# Patient Record
Sex: Female | Born: 1970 | Race: Black or African American | Hispanic: No | Marital: Married | Smoking: Never smoker
Health system: Southern US, Community
[De-identification: ages and names within clinical notes are randomized; demographics above are authoritative.]

## PROBLEM LIST (undated history)

## (undated) DIAGNOSIS — I341 Nonrheumatic mitral (valve) prolapse: Secondary | ICD-10-CM

## (undated) DIAGNOSIS — D219 Benign neoplasm of connective and other soft tissue, unspecified: Secondary | ICD-10-CM

## (undated) DIAGNOSIS — R319 Hematuria, unspecified: Secondary | ICD-10-CM

## (undated) DIAGNOSIS — I10 Essential (primary) hypertension: Secondary | ICD-10-CM

## (undated) DIAGNOSIS — C801 Malignant (primary) neoplasm, unspecified: Secondary | ICD-10-CM

## (undated) DIAGNOSIS — N920 Excessive and frequent menstruation with regular cycle: Secondary | ICD-10-CM

---

## 2001-02-09 ENCOUNTER — Other Ambulatory Visit: Admission: RE | Admit: 2001-02-09 | Discharge: 2001-02-09 | Payer: Self-pay | Admitting: Obstetrics and Gynecology

## 2001-11-16 ENCOUNTER — Other Ambulatory Visit: Admission: RE | Admit: 2001-11-16 | Discharge: 2001-11-16 | Payer: Self-pay | Admitting: Obstetrics and Gynecology

## 2002-05-30 ENCOUNTER — Inpatient Hospital Stay (HOSPITAL_COMMUNITY): Admission: AD | Admit: 2002-05-30 | Discharge: 2002-06-02 | Payer: Self-pay | Admitting: Obstetrics and Gynecology

## 2002-06-28 ENCOUNTER — Other Ambulatory Visit: Admission: RE | Admit: 2002-06-28 | Discharge: 2002-06-28 | Payer: Self-pay | Admitting: Obstetrics and Gynecology

## 2003-05-18 ENCOUNTER — Emergency Department (HOSPITAL_COMMUNITY): Admission: EM | Admit: 2003-05-18 | Discharge: 2003-05-18 | Payer: Self-pay | Admitting: Emergency Medicine

## 2003-05-18 ENCOUNTER — Encounter: Payer: Self-pay | Admitting: Emergency Medicine

## 2007-10-14 ENCOUNTER — Inpatient Hospital Stay (HOSPITAL_COMMUNITY): Admission: AD | Admit: 2007-10-14 | Discharge: 2007-10-16 | Payer: Self-pay | Admitting: Obstetrics and Gynecology

## 2011-01-07 ENCOUNTER — Emergency Department: Payer: Self-pay | Admitting: Emergency Medicine

## 2011-03-28 ENCOUNTER — Ambulatory Visit: Payer: Self-pay | Admitting: Urology

## 2011-04-22 LAB — CBC
HCT: 30.5 — ABNORMAL LOW
HCT: 33.5 — ABNORMAL LOW
Hemoglobin: 10.2 — ABNORMAL LOW
Hemoglobin: 11.5 — ABNORMAL LOW
MCHC: 33.5
MCHC: 34.3
MCV: 100.1 — ABNORMAL HIGH
MCV: 99
Platelets: 159
Platelets: 185
RBC: 3.05 — ABNORMAL LOW
RBC: 3.38 — ABNORMAL LOW
RDW: 14.2
RDW: 14.4
WBC: 10.5
WBC: 8.9

## 2011-04-22 LAB — RPR: RPR Ser Ql: NONREACTIVE

## 2015-03-03 ENCOUNTER — Other Ambulatory Visit: Payer: Self-pay

## 2015-03-03 ENCOUNTER — Emergency Department
Admission: EM | Admit: 2015-03-03 | Discharge: 2015-03-04 | Disposition: A | Discharge status: Home / Self Care | Payer: Managed Care, Other (non HMO) | Attending: Emergency Medicine | Admitting: Emergency Medicine

## 2015-03-03 DIAGNOSIS — Z3202 Encounter for pregnancy test, result negative: Secondary | ICD-10-CM | POA: Insufficient documentation

## 2015-03-03 DIAGNOSIS — R1084 Generalized abdominal pain: Secondary | ICD-10-CM | POA: Diagnosis not present

## 2015-03-03 DIAGNOSIS — R1033 Periumbilical pain: Secondary | ICD-10-CM | POA: Diagnosis present

## 2015-03-03 HISTORY — DX: Essential (primary) hypertension: I10

## 2015-03-03 LAB — COMPREHENSIVE METABOLIC PANEL
ALK PHOS: 41 U/L (ref 38–126)
ALT: 12 U/L — AB (ref 14–54)
AST: 16 U/L (ref 15–41)
Albumin: 4.2 g/dL (ref 3.5–5.0)
Anion gap: 7 (ref 5–15)
BILIRUBIN TOTAL: 0.2 mg/dL — AB (ref 0.3–1.2)
BUN: 8 mg/dL (ref 6–20)
CALCIUM: 8.6 mg/dL — AB (ref 8.9–10.3)
CHLORIDE: 103 mmol/L (ref 101–111)
CO2: 27 mmol/L (ref 22–32)
Creatinine, Ser: 0.67 mg/dL (ref 0.44–1.00)
GFR calc non Af Amer: >60 mL/min (ref >60)
GLUCOSE: 83 mg/dL (ref 65–99)
POTASSIUM: 3 mmol/L — AB (ref 3.5–5.1)
Sodium: 137 mmol/L (ref 135–145)
TOTAL PROTEIN: 7.1 g/dL (ref 6.5–8.1)

## 2015-03-03 LAB — URINALYSIS COMPLETE WITH MICROSCOPIC (ARMC ONLY)
Bilirubin Urine: NEGATIVE
Glucose, UA: NEGATIVE mg/dL
Leukocytes, UA: NEGATIVE
NITRITE: NEGATIVE
PH: 6 (ref 5.0–8.0)
Protein, ur: NEGATIVE mg/dL
SPECIFIC GRAVITY, URINE: 1.014 (ref 1.005–1.030)

## 2015-03-03 LAB — CBC
HCT: 36.9 % (ref 35.0–47.0)
Hemoglobin: 12.5 g/dL (ref 12.0–16.0)
MCH: 33.4 pg (ref 26.0–34.0)
MCHC: 34 g/dL (ref 32.0–36.0)
MCV: 98.3 fL (ref 80.0–100.0)
Platelets: 226 10*3/uL (ref 150–440)
RBC: 3.76 MIL/uL — ABNORMAL LOW (ref 3.80–5.20)
RDW: 13.2 % (ref 11.5–14.5)
WBC: 5.4 10*3/uL (ref 3.6–11.0)

## 2015-03-03 LAB — POCT PREGNANCY, URINE: Preg Test, Ur: NEGATIVE

## 2015-03-03 LAB — LIPASE, BLOOD: LIPASE: 29 U/L (ref 22–51)

## 2015-03-03 NOTE — ED Provider Notes (Signed)
[facility] Emergency Department Provider Note   ____________________________________________  Time seen: 0005  I have reviewed the triage vital signs and the nursing notes.   HISTORY  Chief Complaint Abdominal Pain   History limited by: Not Limited   HPI Lindsay Jackson is a 44 y.o. female who presents to the emergency department today because of concerns of abdominal pain, bloating. Patient states symptoms started yesterday morning and been persistent since then. She states they're somewhat worse with movement. She states she tried taking Imodium without any relief. She states the feeling of bloating and swelling. Her pain is located periumbilically. She denies any associated nausea or vomiting. She denies any fevers. He initially went from a clinic who sent her here for CT scan.     No past medical history on file.  There are no active problems to display for this patient.   No past surgical history on file.  No current outpatient prescriptions on file.  Allergies Sulfur  No family history on file.  Social History Not a smoker Does use alcohol Review of Systems  Constitutional: Negative for fever. Cardiovascular: Negative for chest pain. Respiratory: Negative for shortness of breath. Gastrointestinal: Positive for abdominal pain Genitourinary: Negative for dysuria. Musculoskeletal: Negative for back pain. Skin: Negative for rash. Neurological: Negative for headaches, focal weakness or numbness.   10-point ROS otherwise negative.  ____________________________________________   PHYSICAL EXAM:  VITAL SIGNS: ED Triage Vitals  Enc Vitals Group     BP 03/03/15 1916 135/93 mmHg     Pulse Rate 03/03/15 1916 84     Resp 03/03/15 1916 18     Temp 03/03/15 1916 98.1 F (36.7 C)     Temp Source 03/03/15 1916 Oral     SpO2 03/03/15 1916 100 %     Weight 03/03/15 1916 122 lb (55.339 kg)     Height 03/03/15 1916 5\' 6"  (1.676 m)    Constitutional: Alert and oriented. Well appearing and in no distress. Eyes: Conjunctivae are normal. PERRL. Normal extraocular movements. ENT   Head: Normocephalic and atraumatic.   Nose: No congestion/rhinnorhea.   Mouth/Throat: Mucous membranes are moist.   Neck: No stridor. Hematological/Lymphatic/Immunilogical: No cervical lymphadenopathy. Cardiovascular: Normal rate, regular rhythm.  No murmurs, rubs, or gallops. Respiratory: Normal respiratory effort without tachypnea nor retractions. Breath sounds are clear and equal bilaterally. No wheezes/rales/rhonchi. Gastrointestinal: Soft. Tender to palpation periumbilically. There was some tenderness to percussion. Mild guarding. Mild rebound. Genitourinary: Deferred Musculoskeletal: Normal range of motion in all extremities. No joint effusions.  No lower extremity tenderness nor edema. Neurologic:  Normal speech and language. No gross focal neurologic deficits are appreciated. Speech is normal.  Skin:  Skin is warm, dry and intact. No rash noted. Psychiatric: Mood and affect are normal. Speech and behavior are normal. Patient exhibits appropriate insight and judgment.  ____________________________________________    LABS (pertinent positives/negatives)  Labs Reviewed  COMPREHENSIVE METABOLIC PANEL - Abnormal; Notable for the following:    Potassium 3.0 (*)    Calcium 8.6 (*)    ALT 12 (*)    Total Bilirubin 0.2 (*)    All other components within normal limits  CBC - Abnormal; Notable for the following:    RBC 3.76 (*)    All other components within normal limits  URINALYSIS COMPLETEWITH MICROSCOPIC ([facility] ONLY) - Abnormal; Notable for the following:    Color, Urine YELLOW (*)    APPearance CLEAR (*)    Ketones, ur TRACE (*)  Hgb urine dipstick 3+ (*)    Bacteria, UA RARE (*)    Squamous Epithelial / LPF 0-5 (*)    All other components within normal limits  LIPASE, BLOOD  POC URINE PREG, ED  POCT PREGNANCY,  URINE     ____________________________________________   EKG  I, Nance Pear, attending physician, personally viewed and interpreted this EKG  EKG Time: 1924 Rate: 81 Rhythm: normal sinus rhythm Axis: normal Intervals: normal QRS: normal ST changes: no st elevation    ____________________________________________    RADIOLOGY  CT abd/pel IMPRESSION: Enlarged heterogeneous uterus likely represents leiomyomata. Small to moderate amount of free fluid in the pelvis, which can be seen with ruptured ovarian cyst though is nonspecific.  ____________________________________________   PROCEDURES  Procedure(s) performed: None  Critical Care performed: No  ____________________________________________   INITIAL IMPRESSION / ASSESSMENT AND PLAN / ED COURSE  Pertinent labs & imaging results that were available during my care of the patient were reviewed by me and considered in my medical decision making (see chart for details).  Patient presented to the emergency department today because of concerns for abdominal pain. Blood work without any concerning findings. A CT abdomen and pelvis was obtained given mild guarding and rebound on exam. This however did not show any clear etiology of the symptoms. This point the patient is safe for discharge. Will discharge home with Bentyl. Did discuss return precautions.  ____________________________________________   FINAL CLINICAL IMPRESSION(S) / ED DIAGNOSES  Final diagnoses:  Generalized abdominal pain     Nance Pear, MD 03/04/15 [phone]

## 2015-03-03 NOTE — ED Notes (Signed)
Patient reports s/s began yesterday with abd pain and feeling of swelling.  Patient denies vomiting.

## 2015-03-03 NOTE — ED Notes (Signed)
Brought over from [facility] with abd pain and swelling for couple of days

## 2015-03-04 ENCOUNTER — Emergency Department: Payer: Managed Care, Other (non HMO)

## 2015-03-04 MED ORDER — IOHEXOL 240 MG/ML SOLN
25.0000 mL | INTRAMUSCULAR | Status: AC
  Administered 2015-03-04: 25 mL via ORAL

## 2015-03-04 MED ORDER — DICYCLOMINE HCL 20 MG PO TABS: 20.0000 mg | ORAL_TABLET | Freq: Three times a day (TID) | ORAL | Status: DC | PRN

## 2015-03-04 MED ORDER — IOHEXOL 300 MG/ML  SOLN
100.0000 mL | Freq: Once | INTRAMUSCULAR | Status: AC | PRN
  Administered 2015-03-04: 100 mL via INTRAVENOUS

## 2015-03-04 NOTE — Discharge Instructions (Signed)
Please seek medical attention for any high fevers, chest pain, shortness of breath, change in behavior, persistent vomiting, bloody stool or any other new or concerning symptoms.  Abdominal Pain, Women Abdominal (stomach, pelvic, or belly) pain can be caused by many things. It is important to tell your doctor:  The location of the pain.  Does it come and go or is it present all the time?  Are there things that start the pain (eating certain foods, exercise)?  Are there other symptoms associated with the pain (fever, nausea, vomiting, diarrhea)? All of this is helpful to know when trying to find the cause of the pain. CAUSES   Stomach: virus or bacteria infection, or ulcer.  Intestine: appendicitis (inflamed appendix), regional ileitis (Crohn's disease), ulcerative colitis (inflamed colon), irritable bowel syndrome, diverticulitis (inflamed diverticulum of the colon), or cancer of the stomach or intestine.  Gallbladder disease or stones in the gallbladder.  Kidney disease, kidney stones, or infection.  Pancreas infection or cancer.  Fibromyalgia (pain disorder).  Diseases of the female organs:  Uterus: fibroid (non-cancerous) tumors or infection.  Fallopian tubes: infection or tubal pregnancy.  Ovary: cysts or tumors.  Pelvic adhesions (scar tissue).  Endometriosis (uterus lining tissue growing in the pelvis and on the pelvic organs).  Pelvic congestion syndrome (female organs filling up with blood just before the menstrual period).  Pain with the menstrual period.  Pain with ovulation (producing an egg).  Pain with an IUD (intrauterine device, birth control) in the uterus.  Cancer of the female organs.  Functional pain (pain not caused by a disease, may improve without treatment).  Psychological pain.  Depression. DIAGNOSIS  Your doctor will decide the seriousness of your pain by doing an examination.  Blood tests.  X-rays.  Ultrasound.  CT scan  (computed tomography, special type of X-ray).  MRI (magnetic resonance imaging).  Cultures, for infection.  Barium enema (dye inserted in the large intestine, to better view it with X-rays).  Colonoscopy (looking in intestine with a lighted tube).  Laparoscopy (minor surgery, looking in abdomen with a lighted tube).  Major abdominal exploratory surgery (looking in abdomen with a large incision). TREATMENT  The treatment will depend on the cause of the pain.   Many cases can be observed and treated at home.  Over-the-counter medicines recommended by your caregiver.  Prescription medicine.  Antibiotics, for infection.  Birth control pills, for painful periods or for ovulation pain.  Hormone treatment, for endometriosis.  Nerve blocking injections.  Physical therapy.  Antidepressants.  Counseling with a psychologist or psychiatrist.  Minor or major surgery. HOME CARE INSTRUCTIONS   Do not take laxatives, unless directed by your caregiver.  Take over-the-counter pain medicine only if ordered by your caregiver. Do not take aspirin because it can cause an upset stomach or bleeding.  Try a clear liquid diet (broth or water) as ordered by your caregiver. Slowly move to a bland diet, as tolerated, if the pain is related to the stomach or intestine.  Have a thermometer and take your temperature several times a day, and record it.  Bed rest and sleep, if it helps the pain.  Avoid sexual intercourse, if it causes pain.  Avoid stressful situations.  Keep your follow-up appointments and tests, as your caregiver orders.  If the pain does not go away with medicine or surgery, you may try:  Acupuncture.  Relaxation exercises (yoga, meditation).  Group therapy.  Counseling. SEEK MEDICAL CARE IF:   You notice certain foods cause stomach  pain.  Your home care treatment is not helping your pain.  You need stronger pain medicine.  You want your IUD removed.  You  feel faint or lightheaded.  You develop nausea and vomiting.  You develop a rash.  You are having side effects or an allergy to your medicine. SEEK IMMEDIATE MEDICAL CARE IF:   Your pain does not go away or gets worse.  You have a fever.  Your pain is felt only in portions of the abdomen. The right side could possibly be appendicitis. The left lower portion of the abdomen could be colitis or diverticulitis.  You are passing blood in your stools (bright red or black tarry stools, with or without vomiting).  You have blood in your urine.  You develop chills, with or without a fever.  You pass out. MAKE SURE YOU:   Understand these instructions.  Will watch your condition.  Will get help right away if you are not doing well or get worse. Document Released: 05/12/2007 Document Revised: 11/29/2013 Document Reviewed: 06/01/2009 [facility] Patient Information 2015 [place], [place]. This information is not intended to replace advice given to you by your health care provider. Make sure you discuss any questions you have with your health care provider.

## 2015-03-04 NOTE — ED Notes (Signed)
Dr. Goodman at bedside.  

## 2017-01-23 IMAGING — CT CT ABD-PELV W/ CM
2 of 5 series · 17 of 46 positions shown, 19 images · IV contrast (omnipaque)
Comparison: None.

CLINICAL DATA: Abdominal pain and swelling beginning yesterday. No
vomiting. Evaluate periumbilical pain.

EXAM:
CT ABDOMEN AND PELVIS WITH CONTRAST
TECHNIQUE: Multidetector CT imaging of the abdomen and pelvis was performed
using the standard protocol following bolus administration of
intravenous contrast.
CONTRAST:  100mL OMNIPAQUE IOHEXOL 300 MG/ML  SOLN

[Series 2: routine abd pel with · axial · 0.60mm/px · z∈[-441,-76]mm · 14 of 83 slices shown, 16 images]
[im 5/83  soft-tissue]
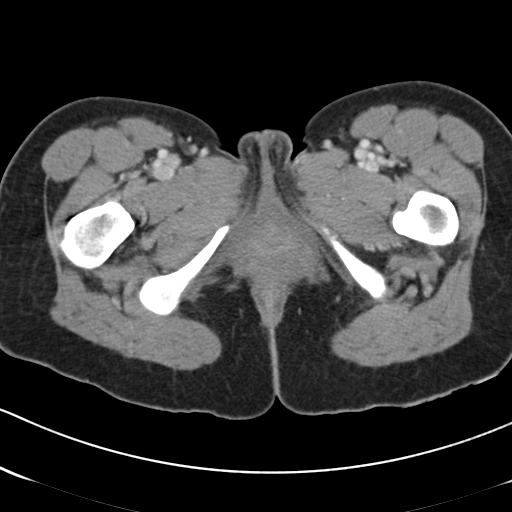
[im 5/83  bone]
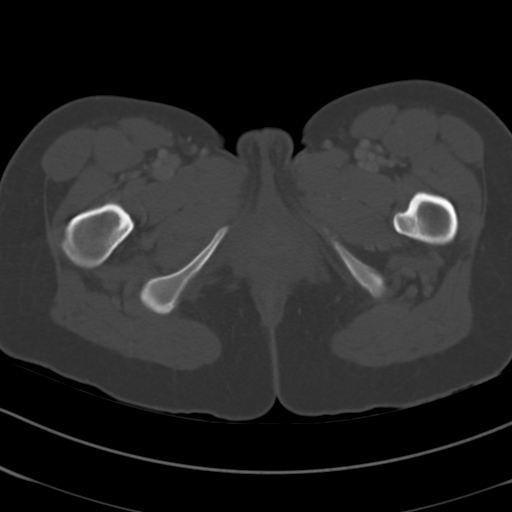
[im 9/83  soft-tissue]
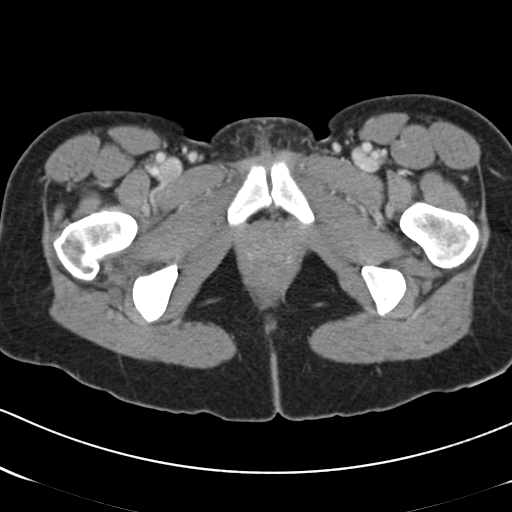
[im 18/83  soft-tissue]
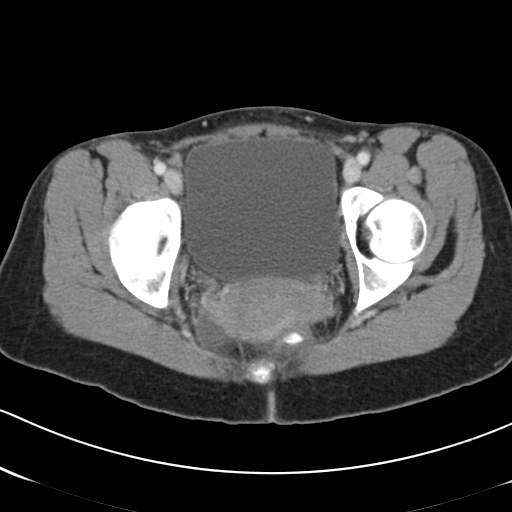
[im 22/83  soft-tissue]
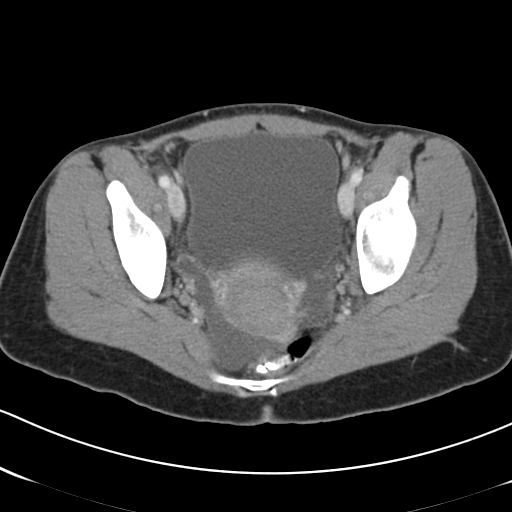
[im 26/83  soft-tissue]
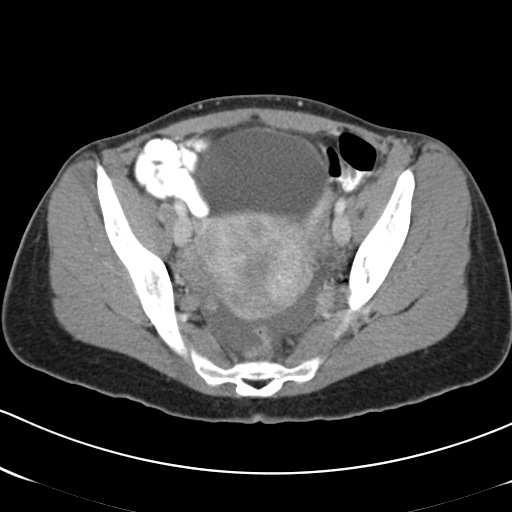
[im 35/83  soft-tissue]
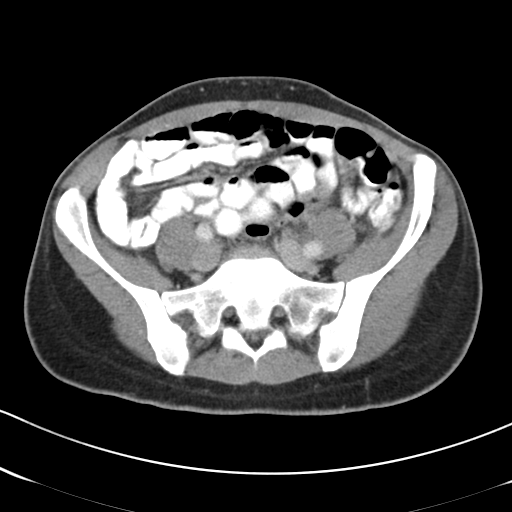
[im 39/83  soft-tissue]
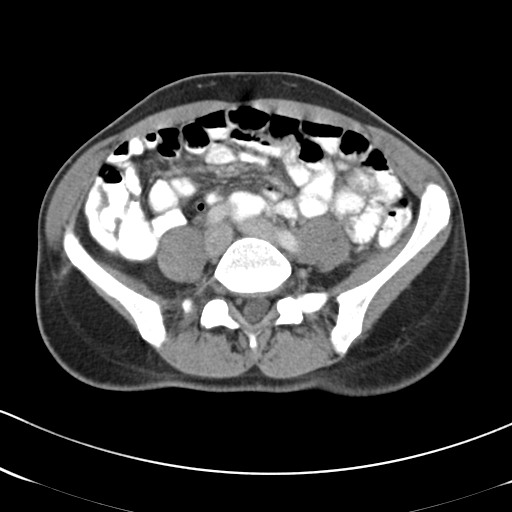
[im 44/83  soft-tissue]
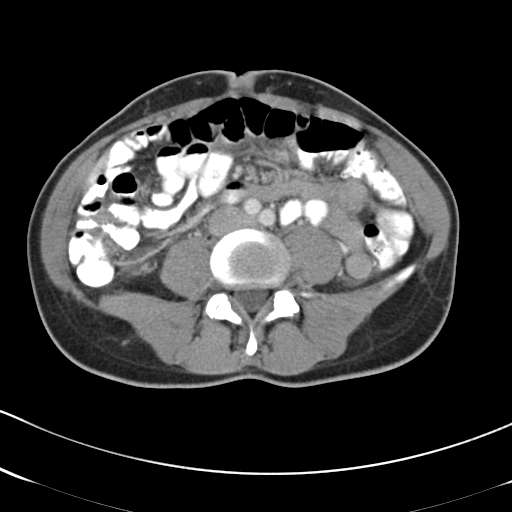
[im 48/83  soft-tissue]
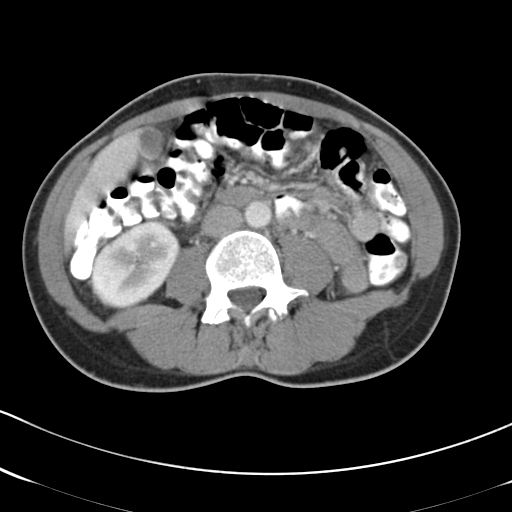
[im 48/83  bone]
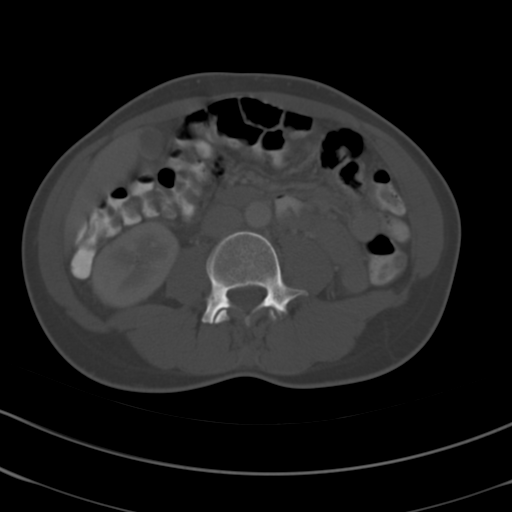
[im 57/83  soft-tissue]
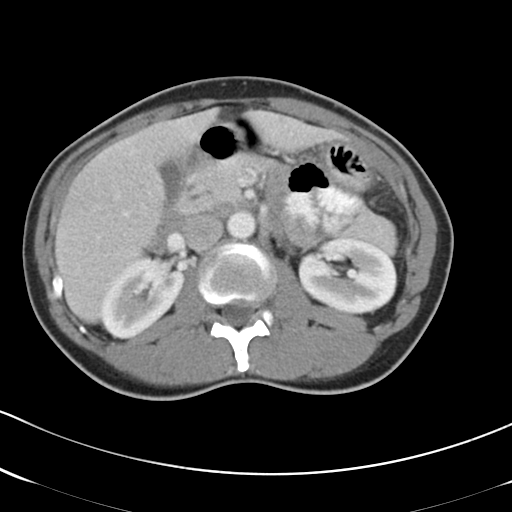
[im 61/83  soft-tissue]
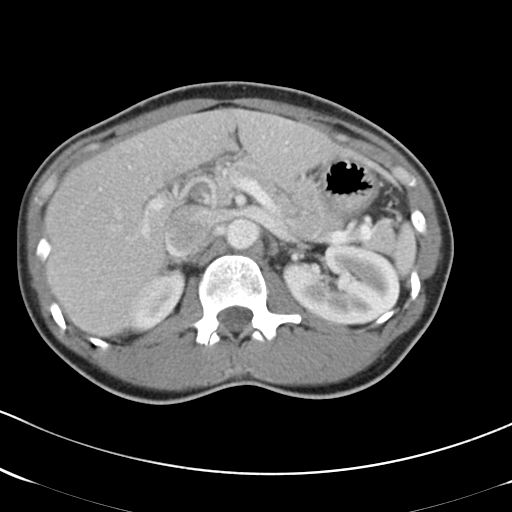
[im 65/83  soft-tissue]
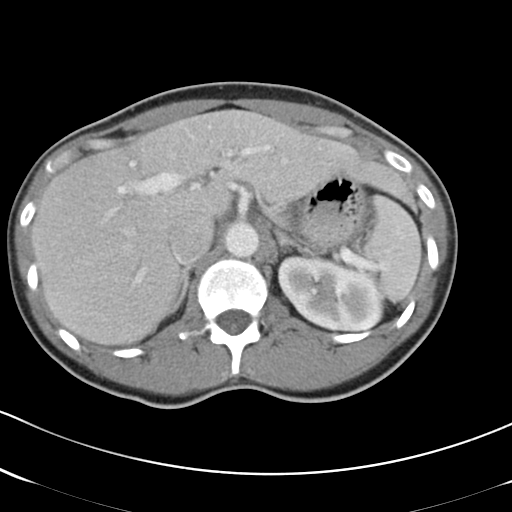
[im 74/83  soft-tissue]
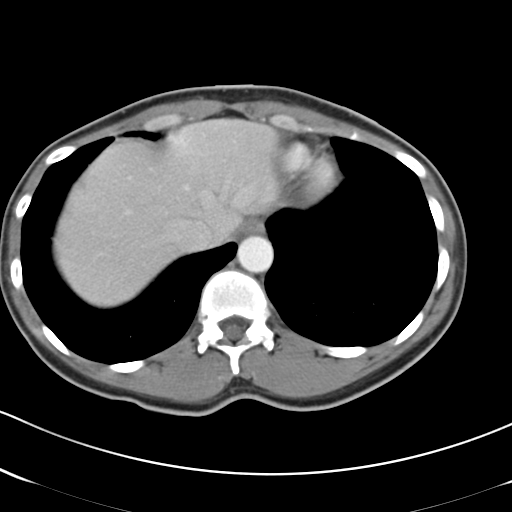
[im 78/83  soft-tissue]
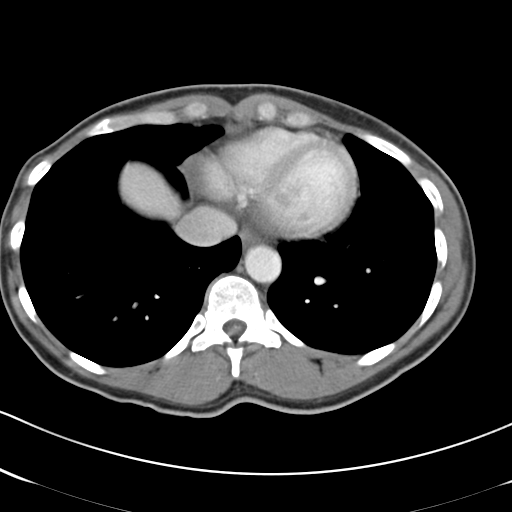

[Series 6: cor routine abd pel with · coronal · 0.85mm/px · 3 of 105 slices shown]
[im 35/105  soft-tissue]
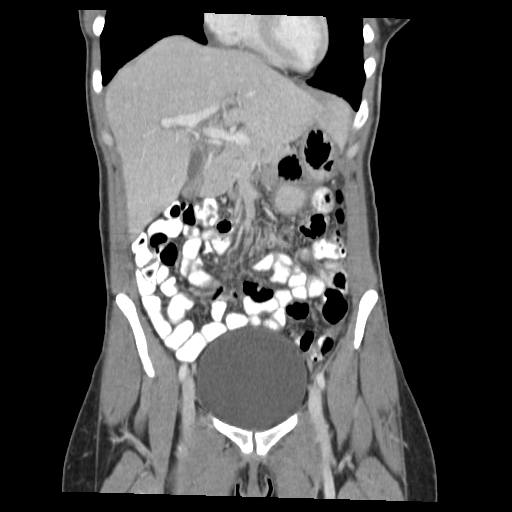
[im 47/105  soft-tissue]
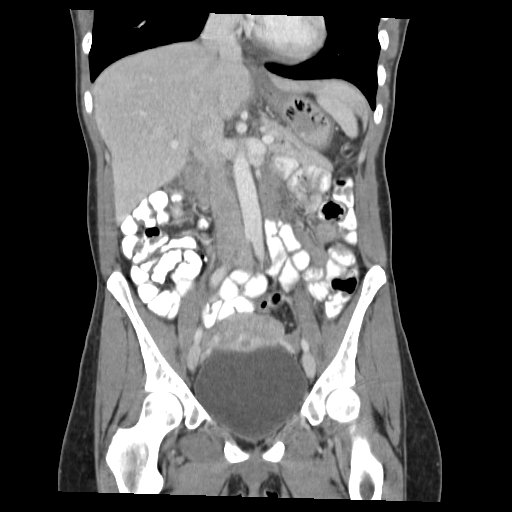
[im 58/105  soft-tissue]
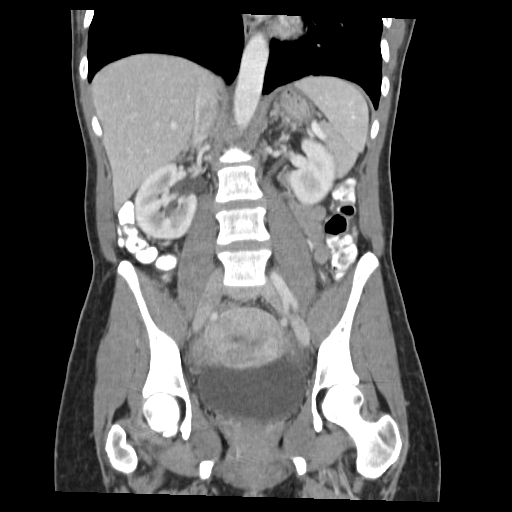

[17 of 46 positions shown; findings below may reference images not displayed]

FINDINGS: LUNG BASES: Included view of the lung bases are clear. Visualized
heart and pericardium are unremarkable.

SOLID ORGANS: The liver, spleen, gallbladder, pancreas and adrenal
glands are unremarkable.

GASTROINTESTINAL TRACT: The stomach, small and large bowel are
normal in course and caliber without inflammatory changes. Normal
appendix.

KIDNEYS/ URINARY TRACT: Kidneys are orthotopic, demonstrating
symmetric enhancement. No nephrolithiasis, hydronephrosis or solid
renal masses. The unopacified ureters are normal in course and
caliber. Delayed imaging through the kidneys demonstrates symmetric
prompt contrast excretion within the proximal urinary collecting
system. Urinary bladder is partially distended and unremarkable.

PERITONEUM/RETROPERITONEUM: Aortoiliac vessels are normal in course
and caliber. No lymphadenopathy by CT size criteria. Mildly enlarged
heterogeneous uterus. Small to moderate amount of low-density free
fluid in the pelvic cul-de-sac. No intraperitoneal free air.

SOFT TISSUE/OSSEOUS STRUCTURES: Non-suspicious.
IMPRESSION: Enlarged heterogeneous uterus likely represents leiomyomata. Small
to moderate amount of free fluid in the pelvis, which can be seen
with ruptured ovarian cyst though is nonspecific.

## 2019-03-19 NOTE — H&P (Signed)
Patient name Lindsay Jackson, Rygg DICTATION# [id number] CSN# [id number]  [name], MD 03/19/2019 11:21 AM

## 2019-03-22 NOTE — H&P (Signed)
NAME: Lindsay Jackson, VALDEZ MEDICAL RECORD [id number] ACCOUNT [id number] DATE OF BIRTH:[birth year] FACILITY: [facility] LOCATION:  PHYSICIAN:Makesha Belitz Sherran Needs, MD  HISTORY AND PHYSICAL  DATE OF ADMISSION:  04/08/2019  HISTORY OF PRESENT ILLNESS:  The patient is a 48 year old gravida 3, para 2 female who presents for total abdominal hysterectomy and removal of both fallopian tubes.  In relation to the present admission, the patient did have enlarging uterine fibroids with associated menorrhagia.  This has been associated with significant anemia.  We have tried to control it with various options.  She is now on iron sulfate  supplementation.  We have had slight improvement of her hemoglobin.  We had discussed options.  This could include radiological embolization of the fibroids.  We were a little concerned about using hormonal agents as she does have hypertension.  In view  of this, the patient now presents for total abdominal hysterectomy and removal of both fallopian tubes.  ALLERGIES:  SULFA.  MEDICATIONS:  She is on enalapril 10 mg tablet, hydrochlorothiazide 25 mg, and temporary on birth control pills.  PAST MEDICAL HISTORY:  Usual childhood diseases without any significant sequelae.  She does have hypertension under active management.  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  Otherwise, usual  childhood diseases without sequelae.  FAMILY HISTORY:  Noncontributory.  SOCIAL HISTORY:  Reveals no tobacco or alcohol use.  REVIEW OF SYSTEMS:  Noncontributory.  PHYSICAL EXAMINATION: VITAL SIGNS:  The patient is afebrile with stable vital signs. HEENT:  Normocephalic.  Pupils equal, round, reactive to light and accommodation.  Extraocular movements are intact.  Sclerae and conjunctivae to clear.  Oropharynx is clear.  Thyroid nonpalpable. BREASTS:  Not examined. LUNGS:  Clear to auscultation and percussion. CARDIOVASCULAR:  Regular rhythm  and rate without murmurs or gallops. ABDOMEN:  The uterus is palpable above the symphysis about halfway to the umbilicus.  No other masses, organomegaly, or tenderness. PELVIC:  Normal external genitalia.  Vaginal mucosa is clear.  Cervix unremarkable.  Uterus approximately 15 weeks in size.  Adnexa unremarkable. EXTREMITIES:  Trace edema. NEUROLOGIC:  Within normal limits.  IMPRESSION: 1.  Enlarging uterine fibroids with abnormal uterine bleeding. 2.  Anemia. 3.  Hypertension. 4.  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  PLAN:  The patient to undergo total abdominal hysterectomy with removal of fallopian tubes.  The risks of surgery have been discussed including the risk of infection, the risk of hemorrhage that could require transfusion with the risk of AIDS or  hepatitis, the risk of injury to adjacent organs including bladder, bowel, ureters that could require further exploratory surgery, and the risk of deep venous thrombosis and pulmonary embolus.  The patient expressed understanding of indication, risks,  and alternatives.  LN/NUANCE  D:03/19/2019 T:03/19/2019 JOB:[id number]

## 2019-04-01 NOTE — Patient Instructions (Addendum)
YOU ARE SCHEDULED FOR A COVID TEST ___9-8-2020______@____11 :00AM________. THIS TEST MUST BE DONE BEFORE SURGERY. GO TO  [address], [place], [address] AND REMAIN IN YOUR CAR, THIS IS A DRIVE UP TEST. ONCE YOUR COVID TEST IS DONE PLEASE FOLLOW ALL THE QUARANTINE  INSTRUCTIONS GIVEN IN YOUR HANDOUT.      Your procedure is scheduled on 04-08-2019   Report to [facility] AT  5:30A. M.   Call this number if you have problems the morning of surgery  :[phone].   OUR ADDRESS IS [address].  WE ARE LOCATED IN [facility].                                     REMEMBER:  DO NOT EAT FOOD OR DRINK LIQUIDS AFTER MIDNIGHT .   TAKE THESE MEDICATIONS MORNING OF SURGERY WITH A SIP OF WATER:  ________NONE___________   YOU ARE SPENDING THE NIGHT AFTER SURGERY AT [facility]. 1 VISITOR IS ALLOWED IN WAITING ROOM ONLY DAY OF SURGERY. NO VISITOR MAY SPEND THE NIGHT.                                    DO NOT WEAR JEWERLY, MAKE UP, OR NAIL POLISH,  DO NOT WEAR LOTIONS, POWDERS, PERFUMES OR DEODORANT. DO NOT SHAVE FOR 24 HOURS PRIOR TO DAY OF SURGERY. MEN MAY SHAVE FACE AND NECK. CONTACTS, GLASSES, OR DENTURES MAY NOT BE WORN TO SURGERY.                                    [facility] IS NOT RESPONSIBLE  FOR ANY BELONGINGS.                                                                    Marland Kitchen                                                                                                    [facility] - Preparing for Surgery Before surgery, you can play an important role.  Because skin is not sterile, your skin needs to be as free of germs as possible.  You can reduce the number of germs on your skin by washing with CHG (chlorahexidine gluconate) soap before surgery.  CHG is an antiseptic cleaner which kills germs and bonds with the skin to continue killing germs even after washing. Please DO NOT use if you have an allergy to CHG or  antibacterial soaps.  If your skin becomes reddened/irritated stop using the CHG and inform your nurse when you arrive at  Short Stay. Do not shave (including legs and underarms) for at least 48 hours prior to the first CHG shower.  You may shave your face/neck. Please follow these instructions carefully:  1.  Shower with CHG Soap the night before surgery and the  morning of Surgery.  2.  If you choose to wash your hair, wash your hair first as usual with your  normal  shampoo.  3.  After you shampoo, rinse your hair and body thoroughly to remove the  shampoo.                           4.  Use CHG as you would any other liquid soap.  You can apply chg directly  to the skin and wash                       Gently with a scrungie or clean washcloth.  5.  Apply the CHG Soap to your body ONLY FROM THE NECK DOWN.   Do not use on face/ open                           Wound or open sores. Avoid contact with eyes, ears mouth and genitals (private parts).                       Wash face,  Genitals (private parts) with your normal soap.             6.  Wash thoroughly, paying special attention to the area where your surgery  will be performed.  7.  Thoroughly rinse your body with warm water from the neck down.  8.  DO NOT shower/wash with your normal soap after using and rinsing off  the CHG Soap.                9.  Pat yourself dry with a clean towel.            10.  Wear clean pajamas.            11.  Place clean sheets on your bed the night of your first shower and do not  sleep with pets. Day of Surgery : Do not apply any lotions/deodorants the morning of surgery.  Please wear clean clothes to the hospital/surgery center.  FAILURE TO FOLLOW THESE INSTRUCTIONS MAY RESULT IN THE CANCELLATION OF YOUR SURGERY PATIENT SIGNATURE_________________________________  NURSE SIGNATURE__________________________________  ________________________________________________________________________   Lindsay Jackson  An incentive spirometer is a tool that can help keep your lungs clear and active. This tool measures how well you are filling your lungs with each breath. Taking long deep breaths may help reverse or decrease the chance of developing breathing (pulmonary) problems (especially infection) following:  A long period of time when you are unable to move or be active. BEFORE THE PROCEDURE   If the spirometer includes an indicator to show your best effort, your nurse or respiratory therapist will set it to a desired goal.  If possible, sit up straight or lean slightly forward. Try not to slouch.  Hold the incentive spirometer in an upright position. INSTRUCTIONS FOR USE  1. Sit on the edge of your bed if possible, or sit up as far as you can in bed or on a chair. 2. Hold the incentive spirometer in an upright position. 3. Breathe out normally. 4. Place the  mouthpiece in your mouth and seal your lips tightly around it. 5. Breathe in slowly and as deeply as possible, raising the piston or the ball toward the top of the column. 6. Hold your breath for 3-5 seconds or for as long as possible. Allow the piston or ball to fall to the bottom of the column. 7. Remove the mouthpiece from your mouth and breathe out normally. 8. Rest for a few seconds and repeat Steps 1 through 7 at least 10 times every 1-2 hours when you are awake. Take your time and take a few normal breaths between deep breaths. 9. The spirometer may include an indicator to show your best effort. Use the indicator as a goal to work toward during each repetition. 10. After each set of 10 deep breaths, practice coughing to be sure your lungs are clear. If you have an incision (the cut made at the time of surgery), support your incision when coughing by placing a pillow or rolled up towels firmly against it. Once you are able to get out of bed, walk around indoors and cough well. You may stop using the incentive spirometer when  instructed by your caregiver.  RISKS AND COMPLICATIONS  Take your time so you do not get dizzy or light-headed.  If you are in pain, you may need to take or ask for pain medication before doing incentive spirometry. It is harder to take a deep breath if you are having pain. AFTER USE  Rest and breathe slowly and easily.  It can be helpful to keep track of a log of your progress. Your caregiver can provide you with a simple table to help with this. If you are using the spirometer at home, follow these instructions: [place] IF:   You are having difficultly using the spirometer.  You have trouble using the spirometer as often as instructed.  Your pain medication is not giving enough relief while using the spirometer.  You develop fever of 100.5 F (38.1 C) or higher. SEEK IMMEDIATE MEDICAL CARE IF:   You cough up bloody sputum that had not been present before.  You develop fever of 102 F (38.9 C) or greater.  You develop worsening pain at or near the incision site. MAKE SURE YOU:   Understand these instructions.  Will watch your condition.  Will get help right away if you are not doing well or get worse. Document Released: 11/25/2006 Document Revised: 10/07/2011 Document Reviewed: 01/26/2007 [facility] Patient Information 2014 [place], [place].    ________________________________________________________________________

## 2019-04-02 ENCOUNTER — Encounter (HOSPITAL_COMMUNITY): Payer: Self-pay

## 2019-04-02 ENCOUNTER — Encounter (HOSPITAL_COMMUNITY)
Admission: RE | Admit: 2019-04-02 | Discharge: 2019-04-02 | Disposition: A | Discharge status: Home / Self Care | Payer: Managed Care, Other (non HMO) | Source: Ambulatory Visit | Attending: Obstetrics and Gynecology | Admitting: Obstetrics and Gynecology

## 2019-04-02 ENCOUNTER — Other Ambulatory Visit: Payer: Self-pay

## 2019-04-02 DIAGNOSIS — Z01818 Encounter for other preprocedural examination: Secondary | ICD-10-CM | POA: Insufficient documentation

## 2019-04-02 DIAGNOSIS — I10 Essential (primary) hypertension: Secondary | ICD-10-CM | POA: Diagnosis not present

## 2019-04-02 DIAGNOSIS — N920 Excessive and frequent menstruation with regular cycle: Secondary | ICD-10-CM | POA: Diagnosis not present

## 2019-04-02 DIAGNOSIS — D259 Leiomyoma of uterus, unspecified: Secondary | ICD-10-CM | POA: Diagnosis not present

## 2019-04-02 HISTORY — DX: Malignant (primary) neoplasm, unspecified: C80.1

## 2019-04-02 HISTORY — DX: Benign neoplasm of connective and other soft tissue, unspecified: D21.9

## 2019-04-02 HISTORY — DX: Hematuria, unspecified: R31.9

## 2019-04-02 HISTORY — DX: Nonrheumatic mitral (valve) prolapse: I34.1

## 2019-04-02 HISTORY — DX: Excessive and frequent menstruation with regular cycle: N92.0

## 2019-04-02 LAB — COMPREHENSIVE METABOLIC PANEL
ALT: 12 U/L (ref 0–44)
AST: 17 U/L (ref 15–41)
Albumin: 4.1 g/dL (ref 3.5–5.0)
Alkaline Phosphatase: 40 U/L (ref 38–126)
Anion gap: 6 (ref 5–15)
BUN: 10 mg/dL (ref 6–20)
CO2: 26 mmol/L (ref 22–32)
Calcium: 9.1 mg/dL (ref 8.9–10.3)
Chloride: 107 mmol/L (ref 98–111)
Creatinine, Ser: 0.78 mg/dL (ref 0.44–1.00)
GFR calc Af Amer: >60 mL/min (ref >60)
GFR calc non Af Amer: >60 mL/min (ref >60)
Glucose, Bld: 84 mg/dL (ref 70–99)
Potassium: 3.9 mmol/L (ref 3.5–5.1)
Sodium: 139 mmol/L (ref 135–145)
Total Bilirubin: 0.6 mg/dL (ref 0.3–1.2)
Total Protein: 7.3 g/dL (ref 6.5–8.1)

## 2019-04-02 LAB — CBC
HCT: 37.5 % (ref 36.0–46.0)
Hemoglobin: 11.8 g/dL — ABNORMAL LOW (ref 12.0–15.0)
MCH: 32.7 pg (ref 26.0–34.0)
MCHC: 31.5 g/dL (ref 30.0–36.0)
MCV: 103.9 fL — ABNORMAL HIGH (ref 80.0–100.0)
Platelets: 280 10*3/uL (ref 150–400)
RBC: 3.61 MIL/uL — ABNORMAL LOW (ref 3.87–5.11)
RDW: 13 % (ref 11.5–15.5)
WBC: 5.3 10*3/uL (ref 4.0–10.5)
nRBC: 0 % (ref 0.0–0.2)

## 2019-04-02 LAB — ABO/RH: ABO/RH(D): B POS

## 2019-04-02 LAB — HCG, SERUM, QUALITATIVE: Preg, Serum: NEGATIVE

## 2019-04-02 NOTE — Progress Notes (Signed)
PCP - [facility] in [place]  Cardiologist -   Chest x-ray -  EKG - epic 2020 Stress Test -  ECHO -  Cardiac Cath -   Sleep Study -  CPAP -   Fasting Blood Sugar -  Checks Blood Sugar _____ times a day  Blood Thinner Instructions: Aspirin Instructions: Last Dose:  Anesthesia review:   Patient reports in 2015 during colonoscopy , her blood pressure was reportedly elevated. She had no dx of of htn prior to colonoscopy sp she was sent for eval. She reports bc of her hx of a mitral valve prolapse dx in her 14s, she was sent was ECHO which she says was normal. She reports she was then placed on BP medication. She denies ever having any cardiac symptoms in the past nor today . EKG obtained at PAT appt today .   Patient denies shortness of breath, fever, cough and chest pain at PAT appointment   Patient verbalized understanding of instructions that were given to them at the PAT appointment. Patient was also instructed that they will need to review over the PAT instructions again at home before surgery.

## 2019-04-06 ENCOUNTER — Other Ambulatory Visit (HOSPITAL_COMMUNITY)
Admission: RE | Admit: 2019-04-06 | Discharge: 2019-04-06 | Disposition: A | Discharge status: Home / Self Care | Payer: Managed Care, Other (non HMO) | Source: Ambulatory Visit | Attending: Obstetrics and Gynecology | Admitting: Obstetrics and Gynecology

## 2019-04-06 DIAGNOSIS — Z20828 Contact with and (suspected) exposure to other viral communicable diseases: Secondary | ICD-10-CM | POA: Insufficient documentation

## 2019-04-06 DIAGNOSIS — Z01812 Encounter for preprocedural laboratory examination: Secondary | ICD-10-CM | POA: Insufficient documentation

## 2019-04-07 LAB — SARS CORONAVIRUS 2 (TAT 6-24 HRS): SARS Coronavirus 2: NEGATIVE

## 2019-04-07 NOTE — Anesthesia Preprocedure Evaluation (Addendum)
Anesthesia Evaluation  Patient identified by MRN, date of birth, ID band Patient awake    Reviewed: Allergy & Precautions, NPO status   Airway Mallampati: II  TM Distance: >3 FB     Dental  (+) Dental Advisory Given, Chipped   Pulmonary    breath sounds clear to auscultation       Cardiovascular hypertension,  Rhythm:Regular Rate:Normal  History noted CG   Neuro/Psych    GI/Hepatic negative GI ROS, Neg liver ROS,   Endo/Other  negative endocrine ROS  Renal/GU negative Renal ROS     Musculoskeletal   Abdominal   Peds  Hematology   Anesthesia Other Findings   Reproductive/Obstetrics                           Anesthesia Physical Anesthesia Plan  ASA: III  Anesthesia Plan: General   Post-op Pain Management:    Induction: Intravenous  PONV Risk Score and Plan: 3 and Ondansetron, Dexamethasone and Midazolam  Airway Management Planned: Oral ETT  Additional Equipment:   Intra-op Plan:   Post-operative Plan:   Informed Consent: I have reviewed the patients History and Physical, chart, labs and discussed the procedure including the risks, benefits and alternatives for the proposed anesthesia with the patient or authorized representative who has indicated his/her understanding and acceptance.     Dental advisory given  Plan Discussed with: Anesthesiologist and CRNA  Anesthesia Plan Comments:        Anesthesia Quick Evaluation

## 2019-04-08 ENCOUNTER — Observation Stay (HOSPITAL_BASED_OUTPATIENT_CLINIC_OR_DEPARTMENT_OTHER)
Admission: RE | Admit: 2019-04-08 | Discharge: 2019-04-09 | Disposition: A | Discharge status: Home / Self Care | Payer: Managed Care, Other (non HMO) | Attending: Obstetrics and Gynecology | Admitting: Obstetrics and Gynecology

## 2019-04-08 ENCOUNTER — Encounter (HOSPITAL_BASED_OUTPATIENT_CLINIC_OR_DEPARTMENT_OTHER): Payer: Self-pay | Admitting: *Deleted

## 2019-04-08 ENCOUNTER — Observation Stay (HOSPITAL_BASED_OUTPATIENT_CLINIC_OR_DEPARTMENT_OTHER): Payer: Managed Care, Other (non HMO) | Admitting: Physician Assistant

## 2019-04-08 ENCOUNTER — Observation Stay (HOSPITAL_BASED_OUTPATIENT_CLINIC_OR_DEPARTMENT_OTHER): Payer: Managed Care, Other (non HMO) | Admitting: Anesthesiology

## 2019-04-08 ENCOUNTER — Encounter (HOSPITAL_BASED_OUTPATIENT_CLINIC_OR_DEPARTMENT_OTHER)
Admission: RE | Disposition: A | Discharge status: Home / Self Care | Payer: Self-pay | Source: Home / Self Care | Attending: Obstetrics and Gynecology

## 2019-04-08 ENCOUNTER — Other Ambulatory Visit: Payer: Self-pay

## 2019-04-08 DIAGNOSIS — Z90710 Acquired absence of both cervix and uterus: Secondary | ICD-10-CM | POA: Diagnosis present

## 2019-04-08 DIAGNOSIS — N920 Excessive and frequent menstruation with regular cycle: Secondary | ICD-10-CM | POA: Diagnosis not present

## 2019-04-08 DIAGNOSIS — Z79899 Other long term (current) drug therapy: Secondary | ICD-10-CM | POA: Insufficient documentation

## 2019-04-08 DIAGNOSIS — I10 Essential (primary) hypertension: Secondary | ICD-10-CM | POA: Diagnosis not present

## 2019-04-08 DIAGNOSIS — N800 Endometriosis of uterus: Secondary | ICD-10-CM | POA: Diagnosis not present

## 2019-04-08 DIAGNOSIS — D259 Leiomyoma of uterus, unspecified: Secondary | ICD-10-CM | POA: Diagnosis present

## 2019-04-08 DIAGNOSIS — Z882 Allergy status to sulfonamides status: Secondary | ICD-10-CM | POA: Insufficient documentation

## 2019-04-08 DIAGNOSIS — D251 Intramural leiomyoma of uterus: Principal | ICD-10-CM | POA: Insufficient documentation

## 2019-04-08 DIAGNOSIS — D649 Anemia, unspecified: Secondary | ICD-10-CM | POA: Diagnosis not present

## 2019-04-08 HISTORY — PX: ABDOMINAL HYSTERECTOMY: SHX81

## 2019-04-08 LAB — TYPE AND SCREEN
ABO/RH(D): B POS
Antibody Screen: NEGATIVE

## 2019-04-08 LAB — POCT PREGNANCY, URINE: Preg Test, Ur: NEGATIVE

## 2019-04-08 SURGERY — HYSTERECTOMY, ABDOMINAL
Anesthesia: General | Site: Abdomen | Laterality: Bilateral

## 2019-04-08 MED ORDER — FENTANYL CITRATE (PF) 100 MCG/2ML IJ SOLN
25.0000 ug | INTRAMUSCULAR | Status: DC | PRN
  Administered 2019-04-08 (×2): 50 ug via INTRAVENOUS
  Filled 2019-04-08: qty 1

## 2019-04-08 MED ORDER — FENTANYL CITRATE (PF) 100 MCG/2ML IJ SOLN
INTRAMUSCULAR | Status: AC
  Filled 2019-04-08: qty 2

## 2019-04-08 MED ORDER — DIPHENHYDRAMINE HCL 50 MG/ML IJ SOLN
12.5000 mg | Freq: Four times a day (QID) | INTRAMUSCULAR | Status: DC | PRN
  Filled 2019-04-08: qty 0.25

## 2019-04-08 MED ORDER — ONDANSETRON HCL 4 MG/2ML IJ SOLN
4.0000 mg | Freq: Four times a day (QID) | INTRAMUSCULAR | Status: DC | PRN
  Filled 2019-04-08: qty 2

## 2019-04-08 MED ORDER — ONDANSETRON HCL 4 MG/2ML IJ SOLN
INTRAMUSCULAR | Status: DC | PRN
  Administered 2019-04-08: 4 mg via INTRAVENOUS

## 2019-04-08 MED ORDER — HYDROCODONE-ACETAMINOPHEN 5-325 MG PO TABS
ORAL_TABLET | ORAL | Status: AC
  Filled 2019-04-08: qty 2

## 2019-04-08 MED ORDER — FENTANYL CITRATE (PF) 250 MCG/5ML IJ SOLN
INTRAMUSCULAR | Status: DC | PRN
  Administered 2019-04-08: 25 ug via INTRAVENOUS
  Administered 2019-04-08 (×2): 50 ug via INTRAVENOUS
  Administered 2019-04-08: 25 ug via INTRAVENOUS
  Administered 2019-04-08: 100 ug via INTRAVENOUS

## 2019-04-08 MED ORDER — FENTANYL CITRATE (PF) 250 MCG/5ML IJ SOLN
INTRAMUSCULAR | Status: AC
  Filled 2019-04-08: qty 5

## 2019-04-08 MED ORDER — PROPOFOL 10 MG/ML IV BOLUS
INTRAVENOUS | Status: DC | PRN
  Administered 2019-04-08: 150 mg via INTRAVENOUS

## 2019-04-08 MED ORDER — SODIUM CHLORIDE (PF) 0.9 % IJ SOLN
INTRAMUSCULAR | Status: DC | PRN
  Administered 2019-04-08: 10 mL

## 2019-04-08 MED ORDER — DIPHENHYDRAMINE HCL 12.5 MG/5ML PO ELIX
12.5000 mg | ORAL_SOLUTION | Freq: Four times a day (QID) | ORAL | Status: DC | PRN
  Filled 2019-04-08: qty 5

## 2019-04-08 MED ORDER — HYDROCODONE-ACETAMINOPHEN 5-325 MG PO TABS
1.0000 | ORAL_TABLET | ORAL | Status: DC | PRN
  Administered 2019-04-09 (×3): 2 via ORAL
  Filled 2019-04-08: qty 2

## 2019-04-08 MED ORDER — MENTHOL 3 MG MT LOZG
1.0000 | LOZENGE | OROMUCOSAL | Status: DC | PRN
  Filled 2019-04-08: qty 9

## 2019-04-08 MED ORDER — LACTATED RINGERS IV SOLN
INTRAVENOUS | Status: DC
  Administered 2019-04-08 (×2): via INTRAVENOUS
  Filled 2019-04-08: qty 1000

## 2019-04-08 MED ORDER — SODIUM CHLORIDE 0.9% FLUSH
9.0000 mL | INTRAVENOUS | Status: DC | PRN
  Filled 2019-04-08: qty 10

## 2019-04-08 MED ORDER — ONDANSETRON HCL 4 MG/2ML IJ SOLN
INTRAMUSCULAR | Status: AC
  Filled 2019-04-08: qty 2

## 2019-04-08 MED ORDER — NALOXONE HCL 0.4 MG/ML IJ SOLN
0.4000 mg | INTRAMUSCULAR | Status: DC | PRN
  Filled 2019-04-08: qty 1

## 2019-04-08 MED ORDER — BUPIVACAINE LIPOSOME 1.3 % IJ SUSP
INTRAMUSCULAR | Status: AC
  Filled 2019-04-08: qty 20

## 2019-04-08 MED ORDER — BUPIVACAINE LIPOSOME 1.3 % IJ SUSP
INTRAMUSCULAR | Status: DC | PRN
  Administered 2019-04-08: 10 mL

## 2019-04-08 MED ORDER — LIDOCAINE 2% (20 MG/ML) 5 ML SYRINGE
INTRAMUSCULAR | Status: DC | PRN
  Administered 2019-04-08: 80 mg via INTRAVENOUS
  Administered 2019-04-08: 20 mg via INTRAVENOUS

## 2019-04-08 MED ORDER — ONDANSETRON HCL 4 MG PO TABS
4.0000 mg | ORAL_TABLET | Freq: Four times a day (QID) | ORAL | Status: DC | PRN
  Filled 2019-04-08: qty 1

## 2019-04-08 MED ORDER — MIDAZOLAM HCL 2 MG/2ML IJ SOLN
INTRAMUSCULAR | Status: AC
  Filled 2019-04-08: qty 2

## 2019-04-08 MED ORDER — CEFAZOLIN SODIUM-DEXTROSE 2-4 GM/100ML-% IV SOLN
2.0000 g | INTRAVENOUS | Status: AC
  Administered 2019-04-08: 2 g via INTRAVENOUS
  Filled 2019-04-08: qty 100

## 2019-04-08 MED ORDER — ONDANSETRON HCL 4 MG/2ML IJ SOLN
4.0000 mg | Freq: Four times a day (QID) | INTRAMUSCULAR | Status: DC | PRN
  Administered 2019-04-08 (×2): 4 mg via INTRAVENOUS
  Filled 2019-04-08: qty 2

## 2019-04-08 MED ORDER — PROPOFOL 10 MG/ML IV BOLUS
INTRAVENOUS | Status: AC
  Filled 2019-04-08: qty 20

## 2019-04-08 MED ORDER — HYDROMORPHONE 1 MG/ML IV SOLN
INTRAVENOUS | Status: DC
  Administered 2019-04-08: 0.2 mg via INTRAVENOUS
  Administered 2019-04-08: 30 mg via INTRAVENOUS
  Administered 2019-04-09: 1.4 mg via INTRAVENOUS
  Filled 2019-04-08 (×2): qty 30

## 2019-04-08 MED ORDER — HYDROMORPHONE HCL 1 MG/ML IJ SOLN
INTRAMUSCULAR | Status: AC
  Filled 2019-04-08: qty 1

## 2019-04-08 MED ORDER — DEXAMETHASONE SODIUM PHOSPHATE 10 MG/ML IJ SOLN
INTRAMUSCULAR | Status: DC | PRN
  Administered 2019-04-08: 10 mg via INTRAVENOUS

## 2019-04-08 MED ORDER — MIDAZOLAM HCL 2 MG/2ML IJ SOLN
INTRAMUSCULAR | Status: DC | PRN
  Administered 2019-04-08: 2 mg via INTRAVENOUS

## 2019-04-08 MED ORDER — SUGAMMADEX SODIUM 200 MG/2ML IV SOLN
INTRAVENOUS | Status: DC | PRN
  Administered 2019-04-08: 150 mg via INTRAVENOUS

## 2019-04-08 MED ORDER — ENALAPRIL MALEATE 10 MG PO TABS
10.0000 mg | ORAL_TABLET | Freq: Every day | ORAL | Status: DC
  Administered 2019-04-08: 19:00:00 10 mg via ORAL
  Filled 2019-04-08: qty 1

## 2019-04-08 MED ORDER — HYDROMORPHONE HCL 1 MG/ML IJ SOLN
0.2500 mg | INTRAMUSCULAR | Status: DC | PRN
  Administered 2019-04-08 (×2): 0.5 mg via INTRAVENOUS
  Filled 2019-04-08: qty 0.5

## 2019-04-08 MED ORDER — LACTATED RINGERS IV SOLN
INTRAVENOUS | Status: DC
  Administered 2019-04-08 (×2): via INTRAVENOUS
  Filled 2019-04-08 (×3): qty 1000

## 2019-04-08 MED ORDER — FENTANYL CITRATE (PF) 100 MCG/2ML IJ SOLN
INTRAMUSCULAR | Status: DC | PRN
  Administered 2019-04-08 (×2): 50 ug via INTRAVENOUS

## 2019-04-08 MED ORDER — ROCURONIUM BROMIDE 10 MG/ML (PF) SYRINGE
PREFILLED_SYRINGE | INTRAVENOUS | Status: DC | PRN
  Administered 2019-04-08: 50 mg via INTRAVENOUS

## 2019-04-08 MED ORDER — BUPIVACAINE HCL (PF) 0.25 % IJ SOLN
INTRAMUSCULAR | Status: DC | PRN
  Administered 2019-04-08: 10 mL

## 2019-04-08 SURGICAL SUPPLY — 41 items
CANISTER SUCT 3000ML PPV (MISCELLANEOUS) ×3 IMPLANT
COVER WAND RF STERILE (DRAPES) ×3 IMPLANT
DERMABOND ADVANCED (GAUZE/BANDAGES/DRESSINGS) ×2
DERMABOND ADVANCED .7 DNX12 (GAUZE/BANDAGES/DRESSINGS) IMPLANT
DRAPE WARM FLUID 44X44 (DRAPES) ×2 IMPLANT
DRSG OPSITE POSTOP 4X10 (GAUZE/BANDAGES/DRESSINGS) ×3 IMPLANT
DURAPREP 26ML APPLICATOR (WOUND CARE) ×3 IMPLANT
GLOVE BIO SURGEON STRL SZ 6.5 (GLOVE) ×1 IMPLANT
GLOVE BIO SURGEON STRL SZ7 (GLOVE) ×7 IMPLANT
GLOVE BIO SURGEONS STRL SZ 6.5 (GLOVE) ×1
GLOVE BIOGEL PI IND STRL 6.5 (GLOVE) IMPLANT
GLOVE BIOGEL PI IND STRL 7.0 (GLOVE) ×3 IMPLANT
GLOVE BIOGEL PI IND STRL 7.5 (GLOVE) IMPLANT
GLOVE BIOGEL PI INDICATOR 6.5 (GLOVE) ×2
GLOVE BIOGEL PI INDICATOR 7.0 (GLOVE) ×8
GLOVE BIOGEL PI INDICATOR 7.5 (GLOVE) ×2
GOWN STRL REUS W/TWL LRG LVL3 (GOWN DISPOSABLE) ×6 IMPLANT
NEEDLE HYPO 22GX1.5 SAFETY (NEEDLE) IMPLANT
NS IRRIG 1000ML POUR BTL (IV SOLUTION) ×3 IMPLANT
PACK ABDOMINAL GYN (CUSTOM PROCEDURE TRAY) ×3 IMPLANT
PAD OB MATERNITY 4.3X12.25 (PERSONAL CARE ITEMS) ×3 IMPLANT
PENCIL BUTTON HOLSTER BLD 10FT (ELECTRODE) ×3 IMPLANT
PROTECTOR NERVE ULNAR (MISCELLANEOUS) ×3 IMPLANT
SPONGE LAP 18X18 RF (DISPOSABLE) ×6 IMPLANT
STAPLER VISISTAT 35W (STAPLE) IMPLANT
SUT CHROMIC 0 SH (SUTURE) IMPLANT
SUT CHROMIC 2 0 SH (SUTURE) IMPLANT
SUT CHROMIC 3 0 SH 27 (SUTURE) IMPLANT
SUT MNCRL AB 4-0 PS2 18 (SUTURE) ×2 IMPLANT
SUT MON AB 4-0 PS1 27 (SUTURE) IMPLANT
SUT PDS AB 0 CT 36 (SUTURE) ×3 IMPLANT
SUT PLAIN 2 0 XLH (SUTURE) ×3 IMPLANT
SUT VIC AB 0 CT1 18XCR BRD8 (SUTURE) ×3 IMPLANT
SUT VIC AB 0 CT1 27 (SUTURE) ×2
SUT VIC AB 0 CT1 27XBRD ANBCTR (SUTURE) ×1 IMPLANT
SUT VIC AB 0 CT1 8-18 (SUTURE) ×6
SUT VIC AB 2-0 CT1 (SUTURE) ×3 IMPLANT
SUT VICRYL 0 TIES 12 18 (SUTURE) ×3 IMPLANT
SYR 30ML LL (SYRINGE) ×3 IMPLANT
TOWEL OR 17X26 10 PK STRL BLUE (TOWEL DISPOSABLE) ×4 IMPLANT
TRAY FOLEY W/BAG SLVR 14FR (SET/KITS/TRAYS/PACK) ×3 IMPLANT

## 2019-04-08 NOTE — Transfer of Care (Signed)
Immediate Anesthesia Transfer of Care Note  Patient: Lindsay Jackson  Procedure(s) Performed: HYSTERECTOMY ABDOMINAL, BILATERAL SALPINGECTOMY (Bilateral Abdomen)  Patient Location: PACU  Anesthesia Type:General  Level of Consciousness: sedated  Airway & Oxygen Therapy: Patient Spontanous Breathing and Patient connected to face mask oxygen  Post-op Assessment: Report given to RN and Post -op Vital signs reviewed and stable  Post vital signs: Reviewed and stable  Last Vitals:  Vitals Value Taken Time  BP 146/129 04/08/19 0845  Temp 36.4 C 04/08/19 0845  Pulse 64 04/08/19 0848  Resp 13 04/08/19 0848  SpO2 100 % 04/08/19 0848  Vitals shown include unvalidated device data.  Last Pain:  Vitals:   04/08/19 0638  TempSrc: Oral  PainSc:       Patients Stated Pain Goal: 7 ([id number])  Complications: No apparent anesthesia complications

## 2019-04-08 NOTE — Anesthesia Procedure Notes (Signed)
Procedure Name: Intubation Date/Time: 04/08/2019 7:35 AM Performed by: Cynda Familia, CRNA Pre-anesthesia Checklist: Patient identified, Emergency Drugs available, Suction available and Patient being monitored Patient Re-evaluated:Patient Re-evaluated prior to induction Oxygen Delivery Method: Circle System Utilized Preoxygenation: Pre-oxygenation with 100% oxygen Induction Type: IV induction and Cricoid Pressure applied Ventilation: Mask ventilation without difficulty Laryngoscope Size: Miller and 2 Grade View: Grade I Tube type: Oral Tube size: 7.0 mm Number of attempts: 1 Airway Equipment and Method: Stylet and Oral airway Placement Confirmation: ETT inserted through vocal cords under direct vision,  positive ETCO2 and breath sounds checked- equal and bilateral Secured at: 22 cm Tube secured with: Tape Dental Injury: Teeth and Oropharynx as per pre-operative assessment  Comments: Smooth IV induction Green-- intubation AM CRNA atraumatic-- front teeth with chipping present prior to laryngoscopy-- unchanged with intubation-- bilat BS

## 2019-04-08 NOTE — Anesthesia Procedure Notes (Signed)
Date/Time: 04/08/2019 8:36 AM Performed by: Cynda Familia, CRNA Oxygen Delivery Method: Simple face mask Placement Confirmation: positive ETCO2 and breath sounds checked- equal and bilateral Dental Injury: Teeth and Oropharynx as per pre-operative assessment

## 2019-04-08 NOTE — H&P (Signed)
  History and physical exam unchanged 

## 2019-04-08 NOTE — Op Note (Signed)
NAMESKYLINN, BATH MEDICAL RECORD [id number] ACCOUNT [id number] DATE OF BIRTH:[birth year] FACILITY: [facility] LOCATION: [facility] PHYSICIAN:Vernisha Bacote Sherran Needs, MD  OPERATIVE REPORT  DATE OF PROCEDURE:  04/08/2019  PREOPERATIVE DIAGNOSIS:  Symptomatic uterine fibroids.  POSTOPERATIVE DIAGNOSIS:  Symptomatic uterine fibroids.  OPERATIVE PROCEDURE:  Total abdominal hysterectomy with bilateral salpingectomy.  SURGEON:  Darlyn Chamber, MD  ASSISTANT:  Matthew Saras   ANESTHESIA:  General endotracheal.  ESTIMATED BLOOD LOSS:  50 mL.  PACKS AND DRAINS:  None.  INTRAOPERATIVE BLOOD PLACED:  None.  COMPLICATIONS:  None.  INDICATIONS:  Dictated in the History and Physical.  DESCRIPTION OF PROCEDURE:  The patient was taken to the OR and placed in supine position.  After a satisfactory level of general anesthesia was obtained, she was placed in the frogleg position.  The perineum and vagina were cleansed with Hibiclens.  A  Foley was placed to straight drain.  Abdomen was prepped with DuraPrep.  After a period of setup, the patient was draped as a sterile field.  A low transverse skin incision was made with a knife, carried through subcutaneous tissue.  The fascia was  entered sharply and the fascia extended laterally.  Rectus muscles were separated in the midline.  Peritoneum was entered sharply.  The incision in the peritoneum was extended both superiorly and inferiorly.  The uterus was delivered through the  incision.  Multiple fibroids were noted.  Tubes and ovaries were unremarkable.  Kellys were applied to the utero-ovarian pedicles.  The right round ligament was clamped, cut, and suture ligated with 0 Vicryl.  The bladder flap was then developed.  The  right utero-ovarian pedicle was developed, cut, and secured with a free tie of 0 Vicryl and a suture ligature of 0 Vicryl.  The bladder flap was further developed.  The parametrium was then clamped, cut, and suture ligated with 0 Vicryl.  We  then went to  the left side.  The left round ligament was clamped, cut, and suture ligated with 0 Vicryl.  The left uteroovarian pedicle was developed, clamped, cut, and secured with a free tie of 0 Vicryl and a suture ligature of 0 Vicryl.  The parametrium was  clamped, cut, and secured with 0 Vicryl.  Using the clamp, cut, and tie technique with suture ligatures of 0 Vicryl, the parametrium was serially separated from the sides of the uterus.  Both uterosacral ligaments were then clamped, cut, and suture  ligated with 0 Vicryl.  The vaginal area was then clamped, cut.  Intervening vaginal mucosa was excised, and the uterus and cervix were passed off the operative field.  The held vaginal mucosa was closed with a figure-of-eight of 0 Vicryl on both sides.   These were held.  The intervening vaginal mucosa was closed with interrupted sutures of 0 Vicryl.  We had good hemostasis.  We irrigated the pelvis.  No active bleeding was noted.  At this point in time, both the right and left fallopian tubes were  elevated.  They were cross-clamped with a Kelly, cut, and the area was secured with a suture ligature of 0 Vicryl.  We had good hemostasis.  Both ovaries appeared to be normal.  The upper abdomen was clear to palpation.  Appendix was normal.  At this  point in time, 1 pack had been put in and it was removed.  Peritoneum was then closed with running suture of 0 Vicryl.  Fascia was closed with running suture of 0 PDS.  Skin was closed with running  subcuticular of 4-0 Monocryl and Dermabond.  Sponge,  instrument, and needle count was correct by circulating nurse x2.  Urine output was clear at the time of closure.  The patient tolerated the procedure well and returned to recovery room in good condition.  LN/NUANCE  D:04/08/2019 T:04/08/2019 JOB:[id number]

## 2019-04-08 NOTE — Anesthesia Postprocedure Evaluation (Signed)
Anesthesia Post Note  Patient: [name]  Procedure(s) Performed: HYSTERECTOMY ABDOMINAL, BILATERAL SALPINGECTOMY (Bilateral Abdomen)     Patient location during evaluation: PACU Anesthesia Type: General Level of consciousness: awake Pain management: pain level controlled Vital Signs Assessment: post-procedure vital signs reviewed and stable Respiratory status: spontaneous breathing Cardiovascular status: stable Postop Assessment: no apparent nausea or vomiting Anesthetic complications: no    Last Vitals:  Vitals:   04/08/19 0900 04/08/19 0910  BP: (!) 145/103   Pulse: 61 65  Resp: 10 18  Temp:    SpO2: 100% 100%    Last Pain:  Vitals:   04/08/19 0638  TempSrc: Oral  PainSc:                  Keron Koffman

## 2019-04-08 NOTE — Progress Notes (Signed)
Patient ID: Lindsay Jackson, female   DOB: 06/18/1971, 48 y.o.   MRN: [id number] AF VSS INC CLEAR GOOD UO MINIMAL BLEEDING

## 2019-04-08 NOTE — Brief Op Note (Signed)
04/08/2019  8:41 AM  PATIENT:  Lindsay Jackson  48 y.o. female  PRE-OPERATIVE DIAGNOSIS:  Fibroids  POST-OPERATIVE DIAGNOSIS:  Fibroids  PROCEDURE:  Procedure(s) with comments: HYSTERECTOMY ABDOMINAL, BILATERAL SALPINGECTOMY (Bilateral) - Need bed  SURGEON:  Surgeon(s) and Role:    * [name], MD - Primary    * Molli Posey, MD - Assisting  PHYSICIAN ASSISTANT:   ASSISTANTS: holland    ANESTHESIA:   local and spinal  EBL:  50 mL   BLOOD ADMINISTERED:none  DRAINS: Urinary Catheter (Foley)   LOCAL MEDICATIONS USED:  MARCAINE     SPECIMEN:  Source of Specimen:  uterus and both fallopian tubes  DISPOSITION OF SPECIMEN:  PATHOLOGY  COUNTS:  YES  TOURNIQUET:  * No tourniquets in log *  DICTATION: .Other Dictation: Dictation Number [id number]  PLAN OF CARE: Admit for overnight observation  PATIENT DISPOSITION:  PACU - hemodynamically stable.   Delay start of Pharmacological VTE agent (>24hrs) due to surgical blood loss or risk of bleeding: no

## 2019-04-09 DIAGNOSIS — D251 Intramural leiomyoma of uterus: Secondary | ICD-10-CM | POA: Diagnosis not present

## 2019-04-09 LAB — CBC
HCT: 29.1 % — ABNORMAL LOW (ref 36.0–46.0)
Hemoglobin: 9.4 g/dL — ABNORMAL LOW (ref 12.0–15.0)
MCH: 33.5 pg (ref 26.0–34.0)
MCHC: 32.3 g/dL (ref 30.0–36.0)
MCV: 103.6 fL — ABNORMAL HIGH (ref 80.0–100.0)
Platelets: 204 10*3/uL (ref 150–400)
RBC: 2.81 MIL/uL — ABNORMAL LOW (ref 3.87–5.11)
RDW: 13.1 % (ref 11.5–15.5)
WBC: 10.5 10*3/uL (ref 4.0–10.5)
nRBC: 0 % (ref 0.0–0.2)

## 2019-04-09 MED ORDER — HYDROCODONE-ACETAMINOPHEN 5-325 MG PO TABS
ORAL_TABLET | ORAL | Status: AC
  Filled 2019-04-09: qty 2

## 2019-04-09 MED ORDER — OXYCODONE-ACETAMINOPHEN 7.5-325 MG PO TABS: 1.0000 | ORAL_TABLET | ORAL | 0 refills | Status: AC | PRN

## 2019-04-09 NOTE — Progress Notes (Signed)
1 Day Post-Op Procedure(s) (LRB): HYSTERECTOMY ABDOMINAL, BILATERAL SALPINGECTOMY (Bilateral)  Subjective: Patient reports tolerating PO and no problems voiding.    Objective: I have reviewed patient's vital signs, intake and output, medications and labs.  General: alert GI: soft, non-tender; bowel sounds normal; no masses,  no organomegaly Vaginal Bleeding: minimal  Assessment: s/p Procedure(s) with comments: HYSTERECTOMY ABDOMINAL, BILATERAL SALPINGECTOMY (Bilateral) - Need bed: stable  Plan: Discharge home  LOS: 0 days    Danessa Mensch 04/09/2019, 8:14 AM

## 2019-04-09 NOTE — Progress Notes (Addendum)
PCA syringe discontinued and 25.5 ml wasted in Stericycle with Cephus Slater, RN. Will continue to monitor patient.

## 2019-04-09 NOTE — Discharge Instructions (Signed)
Abdominal Hysterectomy, Care After This sheet gives you information about how to care for yourself after your procedure. Your health care provider may also give you more specific instructions. If you have problems or questions, contact your health care provider. What can I expect after the procedure? After the procedure, it is common to have:  Pain.  Tiredness (fatigue).  Poor appetite.  Lowered interest in sex.  Bleeding and discharge from your vagina. You may need to use a pad in your underwear after this procedure. Follow these instructions at home: Medicines  Take over-the-counter and prescription medicines only as told by your doctor.  Do not take aspirin or ibuprofen. These medicines can cause bleeding.  Ask your doctor if the medicine prescribed to you: ? Requires you to avoid driving or using heavy machinery. ? Can cause trouble pooping (constipation). You may need to take these actions to prevent or treat trouble pooping:  Take over-the-counter or prescription medicines.  Eat foods that are high in fiber. These include beans, whole grains, and fresh fruits and vegetables.  Limit foods that are high in fat and processed sugars. These include fried or sweet foods. Surgical cut (incision) care      Follow instructions from your doctor about how to take care of your cut from surgery (incision). Make sure you: ? Wash your hands with soap and water before and after you change your bandage (dressing). If you cannot use soap and water, use hand sanitizer. ? Change your bandage as told by your doctor. ? Leave stitches (sutures), skin glue, or skin tape (adhesive) strips in place. They may need to stay in place for 2 weeks or longer. If tape strips get loose and curl up, you may trim the loose edges. Do not remove tape strips completely unless your doctor says it is okay.  Check your cut from surgery every day for signs of infection. Check for: ? Redness, swelling, or  pain. ? Fluid or blood. ? Warmth. ? Pus or a bad smell. Activity  Rest as told by your doctor. ? Do not sit for a long time without moving. Get up to take short walks every 1-2 hours. This is important. Ask for help if you feel weak or unsteady.  Do not lift anything that is heavier than 10 lb (4.5 kg), or the limit that you are told, until your doctor says that it is safe.  Do not drive or use heavy machinery while taking prescription pain medicine.  Follow your doctor's advice about exercise, driving, and general activities. Return to your normal activities as told by your doctor. Ask your doctor what activities are safe for you. Lifestyle  Do not douche, use tampons, or have sex for at least 6 weeks or as told by your doctor.  Do not drink alcohol until your doctor says it is okay.  Do not use any products that contain nicotine or tobacco, such as cigarettes, e-cigarettes, and chewing tobacco. If you need help quitting, ask your doctor. General instructions   Drink enough fluid to keep your pee (urine) pale yellow.  Do not take baths, swim, or use a hot tub until your doctor approves. Ask your doctor if you may take showers. You may only be allowed to take sponge baths.  Try to have someone at home with you for the first 1-2 weeks to help you with your daily chores at home.  Keep the bandage dry until your doctor says it can be taken off.  Wear tight-fitting (  compression) stockings as told by your health care provider. These stockings help to prevent blood clots and reduce swelling in your legs.  Keep all follow-up visits as told by your doctor. This is important. Contact a doctor if:  You have any of these signs of infection: ? Redness, swelling, or pain around your cut. ? Fluid or blood coming from your cut. ? Warmth coming from your cut. ? Pus or a bad smell coming from your cut. ? Chills or a fever.  Your cut breaks open.  You feel dizzy or light-headed.  You  have pain or bleeding when you pee.  You keep having watery poop (diarrhea).  You keep feeling like you may vomit (nauseous) or keep vomiting.  You have unusual fluid (discharge) coming from your vagina.  You have a rash.  You have any type of reaction to your medicine that is not normal, or you develop an allergy to your medicine.  Your pain medicine does not help. Get help right away if:  You have a fever and your symptoms get worse all of a sudden.  You have very bad pain in your belly (abdomen).  You are short of breath.  You faint.  You have pain, swelling, or redness of your leg.  You bleed a lot from your vagina and notice clumps of blood (clots). Summary  Do not take baths, swim, or use a hot tub until your doctor approves. Ask your doctor if you may take showers. You may only be allowed to take sponge baths.  Do not lift anything that is heavier than 10 lb (4.5 kg), or the limit that you are told, until your doctor says that it is safe.  Follow your doctor's advice about exercise, driving, and general activities. Ask your doctor what activities are safe for you.  Try to have someone at home with you for the first 1-2 weeks to help you with your daily chores at home. This information is not intended to replace advice given to you by your health care provider. Make sure you discuss any questions you have with your health care provider. Document Released: 04/23/2008 Document Revised: 09/17/2018 Document Reviewed: 07/03/2016 Elsevier Patient Education  [employer].   Last pain medication given at 9:00am. Can take next dose at 1:00pm

## 2019-04-09 NOTE — Discharge Summary (Signed)
Patient name Lindsay Jackson, faurot DICTATION#[id number] CSN# [id number]  [name], MD 04/09/2019 8:19 AM

## 2019-04-11 NOTE — Discharge Summary (Signed)
NAMECLANCY, LAY MEDICAL RECORD [id number] ACCOUNT [id number] DATE OF BIRTH:07/25/1971 FACILITY: [facility] LOCATION: [facility] PHYSICIAN:Jaymie Misch Sherran Needs, MD  DISCHARGE SUMMARY  DATE OF DISCHARGE:  04/09/2019  ADMITTING DIAGNOSIS:  Symptomatic uterine fibroids.  DISCHARGE DIAGNOSIS:  Symptomatic uterine fibroids.  OPERATIVE PROCEDURE:  A total abdominal hysterectomy with bilateral salpingectomy.  For history and physical, see dictated note.  HOSPITAL COURSE:  The patient underwent above-noted surgery.  Postop did well.  The morning after her surgery, she was tolerating her diet.  The Foley had been discontinued.  She was voiding without difficulty.  Incision was intact.  She was having no  active bleeding.  CBC was still pending.  COMPLICATIONS:  None were encountered during her stay in the hospital.  The patient was discharged home in stable condition.  DISPOSITION:  The patient is to avoid heavy lifting, vaginal entrance, or driving a car.  She is to call with any fever.  Excessive pain should be reported.  Heavy vaginal bleeding should be reported.  Nausea and vomiting should be reported.  Also  instructed in signs and symptoms of deep venous thrombosis and pulmonary embolus.  She will be discharged home on Percocet as she needs it for pain.  LN/NUANCE D:04/09/2019 T:04/09/2019 JOB:[id number]

## 2019-04-12 ENCOUNTER — Encounter (HOSPITAL_BASED_OUTPATIENT_CLINIC_OR_DEPARTMENT_OTHER): Payer: Self-pay | Admitting: Obstetrics and Gynecology

## 2019-09-29 ENCOUNTER — Ambulatory Visit: Payer: Managed Care, Other (non HMO) | Attending: Urology | Admitting: Physical Therapy

## 2019-09-29 ENCOUNTER — Encounter: Payer: Self-pay | Admitting: Physical Therapy

## 2019-09-29 ENCOUNTER — Other Ambulatory Visit: Payer: Self-pay

## 2019-09-29 DIAGNOSIS — R252 Cramp and spasm: Secondary | ICD-10-CM

## 2019-09-29 DIAGNOSIS — R279 Unspecified lack of coordination: Secondary | ICD-10-CM

## 2019-09-29 DIAGNOSIS — M6281 Muscle weakness (generalized): Secondary | ICD-10-CM | POA: Diagnosis present

## 2019-09-29 NOTE — Therapy (Signed)
[facility] [address] [place], [place], [address] Phone: [phone]   Fax:  [phone]  Physical Therapy Evaluation  Patient Details  Name: Lindsay Jackson MRN: [id number] Date of Birth: 03-19-1971 Referring Provider (PT): Franchot Gallo, MD   Encounter Date: 09/29/2019  PT End of Session - 09/29/19 1423    Visit Number  1    Date for PT Re-Evaluation  12/22/19    PT Start Time  1315    PT Stop Time  1349    PT Time Calculation (min)  34 min    Activity Tolerance  Patient tolerated treatment well    Behavior During Therapy  [facility] for tasks assessed/performed       Past Medical History:  Diagnosis Date  . 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 Fibroids   . Hematuria    reports she has microscopic blood in urine ,  sees a kidney specialist  every year to monitor it   . Hypertension   . Menorrhagia   . Mitral valve prolapse    age 49  , reports underwent echo 3-4 years ago after blood pressure went up during colonoscopy , reports echo was fine and has never had any heart problems     Past Surgical History:  Procedure Laterality Date  . ABDOMINAL HYSTERECTOMY Bilateral 04/08/2019   Procedure: HYSTERECTOMY ABDOMINAL, BILATERAL SALPINGECTOMY;  Surgeon: [name], MD;  Location: [place];  Service: Gynecology;  Laterality: Bilateral;  Need bed  . COLONOSCOPY     with remval of anal carcinoma     There were no vitals filed for this visit.   Subjective Assessment - 09/29/19 1318    Subjective  Pt states she has a pressure in the vagina or when peeing.  Sometimes there is a spasm that happens and can happen at any time.  Lower abdominal pain is crippling and happens 3-4 days after intercourse.    Pertinent History  hysterectomy in september 2020;    Patient Stated Goals  get rid of pressure with urination, pain with  intercourse    Currently in Pain?  Yes    Pain Score  9     Pain Location  Abdomen    Pain Orientation  Lower    Pain Descriptors / Indicators  Pressure;Aching    Pain Type  Chronic pain    Pain Onset  --   years   Pain Frequency  Intermittent    Aggravating Factors   intercourse    Pain Relieving Factors  nothing    Multiple Pain Sites  No         [facility] PT Assessment - 09/29/19 0001      Assessment   Medical Diagnosis  R10.2 pelvic pain    Referring Provider (PT)  Franchot Gallo, MD    Onset Date/Surgical Date  --   years   Prior Therapy  No      Precautions   Precautions  None      Balance Screen   Has the patient fallen in the past 6 months  No      [place] residence    Living Arrangements  Spouse/significant other;Children   2 sons     Prior Function   Level of Independence  Independent    Vocation  Full time employment  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      Cognition   Overall Cognitive Status  Within Functional Limits for tasks assessed      Posture/Postural Control   Posture/Postural Control  Postural limitations    Postural Limitations  Decreased thoracic kyphosis      ROM / Strength   AROM / PROM / Strength  Strength;PROM      PROM   Overall PROM Comments  hip ER limted 25% bilateral      Strength   Overall Strength Comments  hip abduction 4/5 bilateral      Flexibility   Soft Tissue Assessment /Muscle Length  yes      Palpation   Palpation comment  tight lumbar, bilateral adductors      Ambulation/Gait   Gait Pattern  Within Functional Limits                Objective measurements completed on examination: See above findings.    Pelvic Floor Special Questions - 09/29/19 0001    Prior Pelvic/Prostate Exam  Yes    Are you Pregnant or attempting pregnancy?  No    Prior Pregnancies  Yes    Number of Vaginal Deliveries  2    Currently Sexually Active  Yes     Is this Painful  Yes    Marinoff Scale  pain prevents any attempts at intercourse    Urinary Leakage  No    Urinary urgency  No    Urinary frequency  nocturia x 2    Fluid intake  2 bottles of water; tea at lunch; glass of water and wine at night    Falling out feeling (prolapse)  No    External Palpation  tight Lt more than Rt ischiocavernosis    Prolapse  None    Pelvic Floor Internal Exam  pt identity confirmed and informed consent given to perform internal assess and treat    Exam Type  Vaginal    Palpation  TTP and tight throughout    Strength  weak squeeze, no lift    Strength # of reps  2    Strength # of seconds  2    Tone  high               PT Education - 09/29/19 1348    Education Details  Access Code: [email or web address]    Person(s) Educated  Patient    Methods  Explanation;Demonstration;Handout;Verbal cues    Comprehension  Verbalized understanding;Returned demonstration          PT Long Term Goals - 09/29/19 1748      PT LONG TERM GOAL #1   Title  50% less frequency of muscle spasms in pelvic floor    Time  12    Period  Weeks    Status  New    Target Date  12/22/19      PT LONG TERM GOAL #2   Title  At least 50% less pain after intercourse    Time  12    Period  Weeks    Status  New    Target Date  12/22/19      PT LONG TERM GOAL #3   Title  not having pulling or pressure before and after voiding - reduced by 75%    Time  12    Period  Weeks    Status  New    Target Date  12/22/19      PT LONG  TERM GOAL #4   Title  ind with HEP    Time  12    Period  Weeks    Status  New    Target Date  12/22/19             Plan - 09/29/19 1353    Clinical Impression Statement  Pt presents to clinic due to pelvic and abdominal pain that has worsened since hysterectomy. Pt has 3/3 Marinoff scale.  She experiences discomfort with full bladder and when emptying her bladder.  Pt has limited hastring flexibily and tight lumbar paraspinals.  Pt is tight and  TTP throughout entire pelvic floor although she does tolerate internal assessment she is tensing and in mild abdominal pain afterwrads.  Pt was educated on diaphragmatic breathing.  Pt has difficutly relaxing and increased and high tone.  Pt co-contracts a lot when attempting to engage the pelvic floor.  Pt can only hold contraction for 2 sec at a 2/5MMT.  Pt will benefit from skilled PT to address these impairments for reduced pain and improved quality of life.    Personal Factors and Comorbidities  Time since onset of injury/illness/exacerbation;Comorbidity 1    Comorbidities  hysterectomy    Examination-Participation Restrictions  Interpersonal Relationship;Community Activity    Stability/Clinical Decision Making  Evolving/Moderate complexity    Clinical Decision Making  Moderate    Rehab Potential  Excellent    PT Frequency  2x / week    PT Duration  12 weeks    PT Treatment/Interventions  ADLs/Self Care Home Management;Biofeedback;Cryotherapy;Electrical Stimulation;Moist Heat;Neuromuscular re-education;Therapeutic exercise;Therapeutic activities;Patient/family education;Manual techniques;Dry needling;Passive range of motion;Taping    PT Next Visit Plan  review stretch and relax pelvic floor; STM lumbar and gluteals; adductors; biofeedback?    PT Home Exercise Plan  Access Code: [email or web address]       Patient will benefit from skilled therapeutic intervention in order to improve the following deficits and impairments:  Impaired tone, Pain, Decreased strength, Decreased endurance, Impaired flexibility, Increased muscle spasms, Increased fascial restricitons, Decreased coordination  Visit Diagnosis: Unspecified lack of coordination  Cramp and spasm  Muscle weakness (generalized)     Problem List Patient Active Problem List   Diagnosis Date Noted  . S/P abdominal hysterectomy 04/08/2019    Jule Ser, PT 09/29/2019, 5:58 PM  [facility] [address] [place], [place], [address] Phone: [phone]   Fax:  [phone]  Name: LORREE YOUSAF MRN: [id number] Date of Birth: 12-10-1970

## 2019-09-29 NOTE — Patient Instructions (Signed)
Access Code: [email or web address]  URL: [email or web address]  Date: 09/29/2019  Prepared by: Jari Favre   Exercises Seated Piriformis Stretch with Trunk Bend - 2 reps - 1 sets - 30 sec hold - 1x daily - 7x weekly Seated Hamstring Stretch - 2 reps - 1 sets - 30 sec hold - 1x daily - 7x weekly Supine Butterfly Groin Stretch - 1 reps - 1 sets - 1 min hold - 1x daily - 7x weekly Supine Hamstring Stretch - 2 reps - 1 sets - sec hold - 1x daily - 7x weekly Supine Figure 4 Piriformis Stretch - 2 reps - 1 sets - 30 sec hold - 1x daily - 7x weekly Supine Piriformis Stretch with Foot on Ground - 2 reps - 1 sets - 30 sec hold - 1x daily - 7x weekly Standing Hip Flexor Stretch - 2 reps - 1 sets - 30 sec hold - 3x daily - 7x weekly Standing Hamstring Stretch with Step - 3 reps - 1 sets - 30 sec hold - 1x daily - 7x weekly Seated Diaphragmatic Breathing - 10 reps - 1 sets - 3x daily - 7x weekly

## 2019-10-20 ENCOUNTER — Ambulatory Visit: Payer: Managed Care, Other (non HMO) | Admitting: Physical Therapy

## 2019-10-20 ENCOUNTER — Encounter: Payer: Self-pay | Admitting: Physical Therapy

## 2019-10-20 ENCOUNTER — Other Ambulatory Visit: Payer: Self-pay

## 2019-10-20 DIAGNOSIS — R279 Unspecified lack of coordination: Secondary | ICD-10-CM

## 2019-10-20 DIAGNOSIS — R252 Cramp and spasm: Secondary | ICD-10-CM

## 2019-10-20 DIAGNOSIS — M6281 Muscle weakness (generalized): Secondary | ICD-10-CM

## 2019-10-20 NOTE — Therapy (Signed)
[facility] [address] [place], [place], [address] Phone: [phone]   Fax:  [phone]  Physical Therapy Treatment  Patient Details  Name: Lindsay Jackson MRN: [id number] Date of Birth: 06-02-1971 Referring Provider (PT): Franchot Gallo, MD   Encounter Date: 10/20/2019  PT End of Session - 10/20/19 1323    Visit Number  2    Date for PT Re-Evaluation  12/22/19    PT Start Time  1319    PT Stop Time  [id number]    PT Time Calculation (min)  28 min    Activity Tolerance  Patient tolerated treatment well    Behavior During Therapy  [facility] for tasks assessed/performed       Past Medical History:  Diagnosis Date  . 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 Fibroids   . Hematuria    reports she has microscopic blood in urine ,  sees a kidney specialist  every year to monitor it   . Hypertension   . Menorrhagia   . Mitral valve prolapse    age 49  , reports underwent echo 3-4 years ago after blood pressure went up during colonoscopy , reports echo was fine and has never had any heart problems     Past Surgical History:  Procedure Laterality Date  . ABDOMINAL HYSTERECTOMY Bilateral 04/08/2019   Procedure: HYSTERECTOMY ABDOMINAL, BILATERAL SALPINGECTOMY;  Surgeon: [name], MD;  Location: [place];  Service: Gynecology;  Laterality: Bilateral;  Need bed  . COLONOSCOPY     with remval of anal carcinoma     There were no vitals filed for this visit.  Subjective Assessment - 10/20/19 1322    Subjective  Just a little pain today.  I have been doing all the stretches    Currently in Pain?  Yes    Pain Score  5     Pain Location  Abdomen    Pain Orientation  Lower    Pain Descriptors / Indicators  Aching;Pressure    Pain Type  Chronic pain    Pain Frequency  Intermittent    Multiple Pain Sites  No                        [facility] Adult PT Treatment/Exercise - 10/20/19 0001      Self-Care   Self-Care  Other Self-Care Comments    Other Self-Care Comments   scar massgae      Manual Therapy   Manual Therapy  Myofascial release    Myofascial Release  abdominal bladder and vaginal canal; scar tissue fascial release and cupping             PT Education - 10/20/19 1348    Education Details  scar massage and relax and stretch with hips on pillows    Person(s) Educated  Patient    Methods  Explanation;Handout;Demonstration;Tactile cues;Verbal cues    Comprehension  Verbalized understanding;Returned demonstration          PT Long Term Goals - 09/29/19 1748      PT LONG TERM GOAL #1   Title  50% less frequency of muscle spasms in pelvic floor    Time  12    Period  Weeks    Status  New    Target Date  12/22/19      PT LONG TERM GOAL #  2   Title  At least 50% less pain after intercourse    Time  12    Period  Weeks    Status  New    Target Date  12/22/19      PT LONG TERM GOAL #3   Title  not having pulling or pressure before and after voiding - reduced by 75%    Time  12    Period  Weeks    Status  New    Target Date  12/22/19      PT LONG TERM GOAL #4   Title  ind with HEP    Time  12    Period  Weeks    Status  New    Target Date  12/22/19            Plan - 10/20/19 1351    Clinical Impression Statement  Pt was having mild pain in abdomen.  No increased pain during treatment.  Today's session focused on fascial release and scar tissue massage.  Goals are ongoing at this time.    Personal Factors and Comorbidities  Time since onset of injury/illness/exacerbation;Comorbidity 1    Examination-Participation Restrictions  Interpersonal Relationship;Community Activity    PT Treatment/Interventions  ADLs/Self Care Home Management;Biofeedback;Cryotherapy;Electrical Stimulation;Moist Heat;Neuromuscular re-education;Therapeutic exercise;Therapeutic  activities;Patient/family education;Manual techniques;Dry needling;Passive range of motion;Taping    PT Next Visit Plan  internal STM, lumbar and gluteal releases and stretches with breathing, biofeedback as needed    PT Home Exercise Plan  Access Code: [email or web address]    Consulted and Agree with Plan of Care  Patient       Patient will benefit from skilled therapeutic intervention in order to improve the following deficits and impairments:  Impaired tone, Pain, Decreased strength, Decreased endurance, Impaired flexibility, Increased muscle spasms, Increased fascial restricitons, Decreased coordination  Visit Diagnosis: Unspecified lack of coordination  Cramp and spasm  Muscle weakness (generalized)     Problem List Patient Active Problem List   Diagnosis Date Noted  . S/P abdominal hysterectomy 04/08/2019    Jule Ser, PT 10/20/2019, 2:18 PM  [facility] [address] [place], [place], [address] Phone: [phone]   Fax:  [phone]  Name: Lindsay Jackson MRN: [id number] Date of Birth: Aug 13, 1970

## 2019-10-20 NOTE — Patient Instructions (Addendum)
Scar Massage  Scar massage is done to improve the mobility of scar, decrease scar tissue from building up, reduce adhesions, and prevent Keloids from forming. Start scar massage after scabs have fallen off by themselves and no open areas. The first few weeks after surgery, it is normal for a scar to appear pink or red and slightly raised. Scars can itch or have areas of numbness. Some scars may be sensitive.   Direct Scar massage: after scar is healed, no opening, no scab 1.  Place pads of two fingers together directly on the scar starting at one end of the scar. Move the fingers up and down across the scar holding 5 seconds one direction.  Then go opposite direction hold 5 seconds.  2. Move over to the next section of the scar and repeat.  Work your way along the entire length of the scar.   3. Next make diagonal movements along the scar holding 5 seconds at one direction. 4. Next movement is side to side. 5. Do not rub fingers over the scar.  Instead keep firm pressure and move scar over the tissue it is on top   Scar Lift and Roll 12 weeks after surgery. 1. Pinch a small amount of the scar between your first two fingers and thumb.  2. Roll the scar between your fingers for 5 to 15 seconds. 3. Move along the scar and repeat until you have massaged the entire length of scar.   Stop the massage and call your doctor if you notice: 1. Increased redness 2. Bleeding from scar 3. Seepage coming from the scar 4. Scar is warmer and has increased pain   [facility] [address], [place], [place] [address] Phone # [phone] Fax [phone]

## 2019-10-27 ENCOUNTER — Encounter: Payer: Self-pay | Admitting: Physical Therapy

## 2019-10-27 ENCOUNTER — Ambulatory Visit: Payer: Managed Care, Other (non HMO) | Admitting: Physical Therapy

## 2019-10-27 ENCOUNTER — Other Ambulatory Visit: Payer: Self-pay

## 2019-10-27 DIAGNOSIS — R279 Unspecified lack of coordination: Secondary | ICD-10-CM | POA: Diagnosis not present

## 2019-10-27 DIAGNOSIS — R252 Cramp and spasm: Secondary | ICD-10-CM

## 2019-10-27 DIAGNOSIS — M6281 Muscle weakness (generalized): Secondary | ICD-10-CM

## 2019-10-27 NOTE — Patient Instructions (Signed)
Access Code: [email or web address] URL: [email or web address] Date: 10/27/2019 Prepared by: Jari Favre  Exercises Seated Piriformis Stretch with Trunk Bend - 1 x daily - 7 x weekly - 2 reps - 1 sets - 30 sec hold Seated Hamstring Stretch - 1 x daily - 7 x weekly - 2 reps - 1 sets - 30 sec hold Supine Butterfly Groin Stretch - 1 x daily - 7 x weekly - 1 reps - 1 sets - 1 min hold Supine Hamstring Stretch - 1 x daily - 7 x weekly - 2 reps - 1 sets - sec hold Supine Figure 4 Piriformis Stretch - 1 x daily - 7 x weekly - 2 reps - 1 sets - 30 sec hold Supine Piriformis Stretch with Foot on Ground - 1 x daily - 7 x weekly - 2 reps - 1 sets - 30 sec hold Standing Hip Flexor Stretch - 3 x daily - 7 x weekly - 2 reps - 1 sets - 30 sec hold Standing Hamstring Stretch with Step - 1 x daily - 7 x weekly - 3 reps - 1 sets - 30 sec hold Seated Diaphragmatic Breathing - 3 x daily - 7 x weekly - 10 reps - 1 sets Child's Pose with Sidebending - 1 x daily - 7 x weekly - 3 reps - 1 sets - 30 sec hold

## 2019-10-27 NOTE — Therapy (Signed)
[facility] [address] [place], [place], [address] Phone: [phone]   Fax:  [phone]  Physical Therapy Treatment  Patient Details  Name: Lindsay Jackson MRN: [id number] Date of Birth: March 07, 1971 Referring Provider (PT): Franchot Gallo, MD   Encounter Date: 10/27/2019  PT End of Session - 10/27/19 1320    Visit Number  3    Date for PT Re-Evaluation  12/22/19    PT Start Time  1315    PT Stop Time  1354    PT Time Calculation (min)  39 min    Activity Tolerance  Patient tolerated treatment well    Behavior During Therapy  [facility] for tasks assessed/performed       Past Medical History:  Diagnosis Date  . Cancer [facility])    i had a teeny tiny carcinoma tumor anal that the doctor took off during my surgery   . Fibroids   . Hematuria    reports she has microscopic blood in urine ,  sees a kidney specialist  every year to monitor it   . Hypertension   . Menorrhagia   . Mitral valve prolapse    age 49  , reports underwent echo 3-4 years ago after blood pressure went up during colonoscopy , reports echo was fine and has never had any heart problems     Past Surgical History:  Procedure Laterality Date  . ABDOMINAL HYSTERECTOMY Bilateral 04/08/2019   Procedure: HYSTERECTOMY ABDOMINAL, BILATERAL SALPINGECTOMY;  Surgeon: [name], MD;  Location: [place];  Service: Gynecology;  Laterality: Bilateral;  Need bed  . COLONOSCOPY     with remval of anal carcinoma     There were no vitals filed for this visit.  Subjective Assessment - 10/27/19 1403    Subjective  Felt a little better but then a few days ago started hurting and spasms lying down, lying on my Rt side, from vagina up to abdomen or abdomen down to vagina. Denies pain currently    Patient Stated Goals  get rid of pressure with urination, pain with intercourse    Currently in Pain?  No/denies                        [facility] Adult PT Treatment/Exercise - 10/27/19 0001      Self-Care   Other Self-Care Comments   ice massage to pelvic floor      Neuro Re-ed    Neuro Re-ed Details   breathing technique with stretches; hips elevated for reduced pressure on pelvic floor      Lumbar Exercises: Stretches   Other Lumbar Stretch Exercise  childs pose with sidebend      Manual Therapy   Manual Therapy  Internal Pelvic Floor;Soft tissue mobilization    Manual therapy comments  pt identity confirmed and informed consent given             PT Education - 10/27/19 1355    Education Details  Access Code: [email or web address]    Person(s) Educated  Patient    Methods  Explanation;Demonstration;Tactile cues;Verbal cues;Handout    Comprehension  Verbalized understanding;Returned demonstration          PT Long Term Goals - 10/27/19 1359      PT LONG TERM GOAL #1   Title  50% less frequency of muscle spasms in pelvic floor    Status  On-going      PT LONG TERM GOAL #2  Title  At least 50% less pain after intercourse    Status  On-going      PT LONG TERM GOAL #3   Title  not having pulling or pressure before and after voiding - reduced by 75%    Status  On-going      PT LONG TERM GOAL #4   Title  ind with HEP    Status  On-going            Plan - 10/27/19 1356    Clinical Impression Statement  Pain was better after previous treatment for a few days.  Pt has very TTP pubococcygeus bilater Rt>Lt.  Pt did well with STM internal to release some muscle tension but still only able to apply light pressure.  pt obdurator internus tight on Rt side able to mobilize externally to ischial tuberosity attachment.  She was educated on ice for self care and pain management at home along with additinoal stretch and breathing technique    Personal Factors and Comorbidities  Time since onset of injury/illness/exacerbation;Comorbidity 1    PT Treatment/Interventions  ADLs/Self Care  Home Management;Biofeedback;Cryotherapy;Electrical Stimulation;Moist Heat;Neuromuscular re-education;Therapeutic exercise;Therapeutic activities;Patient/family education;Manual techniques;Dry needling;Passive range of motion;Taping    PT Next Visit Plan  internal STM and re-assess soft tissue, sitting on ball with towel roll, f/u on ice massage and stretches child pose    PT Home Exercise Plan  Access Code: [email or web address]    Consulted and Agree with Plan of Care  Patient       Patient will benefit from skilled therapeutic intervention in order to improve the following deficits and impairments:  Impaired tone, Pain, Decreased strength, Decreased endurance, Impaired flexibility, Increased muscle spasms, Increased fascial restricitons, Decreased coordination  Visit Diagnosis: Unspecified lack of coordination  Cramp and spasm  Muscle weakness (generalized)     Problem List Patient Active Problem List   Diagnosis Date Noted  . S/P abdominal hysterectomy 04/08/2019    Jule Ser, PT 10/27/2019, 2:04 PM  [facility] [address] [place], [place], [address] Phone: [phone]   Fax:  [phone]  Name: Lindsay Jackson MRN: [id number] Date of Birth: [birth year]

## 2019-10-28 ENCOUNTER — Ambulatory Visit: Payer: Managed Care, Other (non HMO) | Attending: Internal Medicine

## 2019-10-28 DIAGNOSIS — Z23 Encounter for immunization: Secondary | ICD-10-CM

## 2019-10-28 NOTE — Progress Notes (Signed)
   Covid-19 Vaccination Clinic  Name:  Lindsay Jackson    MRN: [id number] DOB: August 28, 1970  10/28/2019  Ms. [name] was observed post Covid-19 immunization for 15 minutes without incident. She was provided with Vaccine Information Sheet and instruction to access the V-Safe system.   Ms. [name] was instructed to call 911 with any severe reactions post vaccine: Marland Kitchen Difficulty breathing  . Swelling of face and throat  . A fast heartbeat  . A bad rash all over body  . Dizziness and weakness   Immunizations Administered    Name Date Dose VIS Date Route   Pfizer COVID-19 Vaccine 10/28/2019  9:32 AM 0.3 mL 07/09/2019 Intramuscular   Manufacturer: [place]   Lot: [id number]   [place]: [id number]

## 2019-11-03 ENCOUNTER — Other Ambulatory Visit: Payer: Self-pay

## 2019-11-03 ENCOUNTER — Ambulatory Visit: Payer: Managed Care, Other (non HMO) | Attending: Urology | Admitting: Physical Therapy

## 2019-11-03 ENCOUNTER — Encounter: Payer: Self-pay | Admitting: Physical Therapy

## 2019-11-03 DIAGNOSIS — M6281 Muscle weakness (generalized): Secondary | ICD-10-CM | POA: Diagnosis present

## 2019-11-03 DIAGNOSIS — R279 Unspecified lack of coordination: Secondary | ICD-10-CM

## 2019-11-03 DIAGNOSIS — R252 Cramp and spasm: Secondary | ICD-10-CM | POA: Insufficient documentation

## 2019-11-03 NOTE — Patient Instructions (Signed)
Access Code: [email or web address] URL: [email or web address] Date: 11/03/2019 Prepared by: Jari Favre  Exercises Seated Piriformis Stretch with Trunk Bend - 1 x daily - 7 x weekly - 2 reps - 1 sets - 30 sec hold Seated Hamstring Stretch - 1 x daily - 7 x weekly - 2 reps - 1 sets - 30 sec hold Supine Butterfly Groin Stretch - 1 x daily - 7 x weekly - 1 reps - 1 sets - 1 min hold Supine Hamstring Stretch - 1 x daily - 7 x weekly - 2 reps - 1 sets - sec hold Supine Figure 4 Piriformis Stretch - 1 x daily - 7 x weekly - 2 reps - 1 sets - 30 sec hold Supine Piriformis Stretch with Foot on Ground - 1 x daily - 7 x weekly - 2 reps - 1 sets - 30 sec hold Standing Hip Flexor Stretch - 3 x daily - 7 x weekly - 2 reps - 1 sets - 30 sec hold Standing Hamstring Stretch with Step - 1 x daily - 7 x weekly - 3 reps - 1 sets - 30 sec hold Seated Diaphragmatic Breathing - 3 x daily - 7 x weekly - 10 reps - 1 sets Child's Pose with Sidebending - 1 x daily - 7 x weekly - 3 reps - 1 sets - 30 sec hold Supine Straight Leg Raises - 1 x daily - 7 x weekly - 10 reps - 3 sets Sidelying Hip Abduction - 1 x daily - 7 x weekly - 10 reps - 3 sets Supine 90/90 Shoulder Flexion with Abdominal Bracing - 1 x daily - 7 x weekly - 10 reps - 3 sets

## 2019-11-03 NOTE — Therapy (Signed)
[facility] [address], [place], [place], [address] Phone: [phone]   Fax:  [phone]  Physical Therapy Treatment  Patient Details  Name: Lindsay Jackson MRN: [id number] Date of Birth: 09-08-1970 Referring Provider (PT): Franchot Gallo, MD   Encounter Date: 11/03/2019  PT End of Session - 11/03/19 1326    Visit Number  4    Date for PT Re-Evaluation  12/22/19    PT Start Time  [id number]    PT Stop Time  1355    PT Time Calculation (min)  40 min    Activity Tolerance  Patient tolerated treatment well    Behavior During Therapy  [facility] for tasks assessed/performed       Past Medical History:  Diagnosis Date  . 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 Fibroids   . Hematuria    reports she has microscopic blood in urine ,  sees a kidney specialist  every year to monitor it   . Hypertension   . Menorrhagia   . Mitral valve prolapse    age 21  , reports underwent echo 3-4 years ago after blood pressure went up during colonoscopy , reports echo was fine and has never had any heart problems     Past Surgical History:  Procedure Laterality Date  . ABDOMINAL HYSTERECTOMY Bilateral 04/08/2019   Procedure: HYSTERECTOMY ABDOMINAL, BILATERAL SALPINGECTOMY;  Surgeon: [name], MD;  Location: [place];  Service: Gynecology;  Laterality: Bilateral;  Need bed  . COLONOSCOPY     with remval of anal carcinoma     There were no vitals filed for this visit.  Subjective Assessment - 11/03/19 1406    Subjective  No pain after intercourse.  Feeling less frequency of pain.    Patient Stated Goals  get rid of pressure with urination, pain with intercourse    Currently in Pain?  No/denies                       [facility] Adult PT Treatment/Exercise - 11/03/19 0001      Self-Care   Other Self-Care Comments   towel roll massage       Neuro Re-ed    Neuro Re-ed Details   circles and rocking on ball      Exercises   Exercises  Lumbar      Lumbar Exercises: Supine   Ab Set  10 reps    Bent Knee Raise  20 reps    Dead Bug Limitations  LE only - 10x    Straight Leg Raise  10 reps    Large Ball Abdominal Isometric Limitations  LE roll and UE overhead    Large Ball Oblique Isometric Limitations  side to side rocking red ball      Lumbar Exercises: Quadruped   Other Quadruped Lumbar Exercises  prone with red ball under belly; LE ext on ball    Other Quadruped Lumbar Exercises  side plank on knee with exhale       lateral leg lift - 10x each      PT Education - 11/03/19 1405    Education Details  Access Code: [email or web address]    Person(s) Educated  Patient    Methods  Explanation;Demonstration;Handout;Verbal cues    Comprehension  Verbalized understanding;Returned demonstration          PT Long Term  Goals - 11/03/19 1335      PT LONG TERM GOAL #1   Title  50% less frequency of muscle spasms in pelvic floor    Baseline  spasms this morning when driving and had some with pressure when sitting on the ball    Status  On-going      PT LONG TERM GOAL #2   Title  At least 50% less pain after intercourse    Status  On-going      PT LONG TERM GOAL #3   Title  not having pulling or pressure before and after voiding - reduced by 75%    Baseline  60-70% improved not as frequent    Status  On-going      PT LONG TERM GOAL #4   Title  ind with HEP    Status  On-going            Plan - 11/03/19 1403    Clinical Impression Statement  Pt reports 60% improved with urinary symptoms.  She reports she did not have pain last time after intercourse.  Pt felt good when resting with ball under her abdomen.  Today's session focused on core strength to reduce overcompensation of pelvic floor.  Pt will benefit from skilled PT to continue to work on msucle strength and coordination    PT Treatment/Interventions  ADLs/Self  Care Home Management;Biofeedback;Cryotherapy;Electrical Stimulation;Moist Heat;Neuromuscular re-education;Therapeutic exercise;Therapeutic activities;Patient/family education;Manual techniques;Dry needling;Passive range of motion;Taping    PT Next Visit Plan  internal STM and re-assess soft tissue,porgress core strength    PT Home Exercise Plan  Access Code: [email or web address]    Consulted and Agree with Plan of Care  Patient       Patient will benefit from skilled therapeutic intervention in order to improve the following deficits and impairments:  Impaired tone, Pain, Decreased strength, Decreased endurance, Impaired flexibility, Increased muscle spasms, Increased fascial restricitons, Decreased coordination  Visit Diagnosis: Unspecified lack of coordination  Cramp and spasm  Muscle weakness (generalized)     Problem List Patient Active Problem List   Diagnosis Date Noted  . S/P abdominal hysterectomy 04/08/2019    Jule Ser, PT 11/03/2019, 2:09 PM  [facility] [address] [place], [place], [address] Phone: [phone]   Fax:  [phone]  Name: Lindsay Jackson MRN: [id number] Date of Birth: 16-Oct-1970

## 2019-11-10 ENCOUNTER — Ambulatory Visit: Payer: Managed Care, Other (non HMO) | Admitting: Physical Therapy

## 2019-11-10 ENCOUNTER — Other Ambulatory Visit: Payer: Self-pay

## 2019-11-10 DIAGNOSIS — R252 Cramp and spasm: Secondary | ICD-10-CM

## 2019-11-10 DIAGNOSIS — M6281 Muscle weakness (generalized): Secondary | ICD-10-CM

## 2019-11-10 DIAGNOSIS — R279 Unspecified lack of coordination: Secondary | ICD-10-CM | POA: Diagnosis not present

## 2019-11-10 NOTE — Therapy (Signed)
[facility] [address] [place], [place], [address] Phone: [phone]   Fax:  [phone]  Physical Therapy Treatment  Patient Details  Name: Lindsay Jackson MRN: [id number] Date of Birth: [birth year] Referring Provider (PT): Franchot Gallo, MD   Encounter Date: 11/10/2019  PT End of Session - 11/10/19 1507    Visit Number  5    Date for PT Re-Evaluation  12/22/19    PT Start Time  1316    PT Stop Time  1354    PT Time Calculation (min)  38 min    Activity Tolerance  Patient tolerated treatment well    Behavior During Therapy  [facility] for tasks assessed/performed       Past Medical History:  Diagnosis Date  . 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 Fibroids   . Hematuria    reports she has microscopic blood in urine ,  sees a kidney specialist  every year to monitor it   . Hypertension   . Menorrhagia   . Mitral valve prolapse    age 49  , reports underwent echo 3-4 years ago after blood pressure went up during colonoscopy , reports echo was fine and has never had any heart problems     Past Surgical History:  Procedure Laterality Date  . ABDOMINAL HYSTERECTOMY Bilateral 04/08/2019   Procedure: HYSTERECTOMY ABDOMINAL, BILATERAL SALPINGECTOMY;  Surgeon: [name], MD;  Location: [place];  Service: Gynecology;  Laterality: Bilateral;  Need bed  . COLONOSCOPY     with remval of anal carcinoma     There were no vitals filed for this visit.  Subjective Assessment - 11/10/19 1320    Subjective  Pt states still a little pressure urinating and some Lower Lt abdominal pain.    Patient Stated Goals  get rid of pressure with urination, pain with intercourse    Pain Score  6     Pain Location  Abdomen    Pain Orientation  Mid;Lower    Pain Descriptors / Indicators  Pressure    Pain Type  Chronic pain    Pain Onset  More than  a month ago    Pain Frequency  Intermittent    Aggravating Factors   urinating    Multiple Pain Sites  No                       [facility] Adult PT Treatment/Exercise - 11/10/19 0001      Self-Care   Other Self-Care Comments   dilators and self massage with dilators      Neuro Re-ed    Neuro Re-ed Details   breathing with diaphragm and relax pelvic floor      Manual Therapy   Manual Therapy  Internal Pelvic Floor;Soft tissue mobilization    Manual therapy comments  pt identity confirmed and informed consent given    Internal Pelvic Floor  all muscles in first layer Rt more inflamed/TTP than Lt side; urethra mobs; pubococcygeus attachment of levators and coccygeus tight             PT Education - 11/10/19 1400    Education Details  stretch pelvic floor with and withoout dilators    Person(s) Educated  Patient    Methods  Explanation;Demonstration;Verbal cues;Handout    Comprehension  Verbalized understanding;Returned demonstration  PT Long Term Goals - 11/03/19 1335      PT LONG TERM GOAL #1   Title  50% less frequency of muscle spasms in pelvic floor    Baseline  spasms this morning when driving and had some with pressure when sitting on the ball    Status  On-going      PT LONG TERM GOAL #2   Title  At least 50% less pain after intercourse    Status  On-going      PT LONG TERM GOAL #3   Title  not having pulling or pressure before and after voiding - reduced by 75%    Baseline  60-70% improved not as frequent    Status  On-going      PT LONG TERM GOAL #4   Title  ind with HEP    Status  On-going            Plan - 11/10/19 1401    Clinical Impression Statement  Pt was very tight and TTP Rt>Lt ischiocavernosis, bulboscav; transverse peroneus.  pt responded well to STM stretches.  Pt was educated in breathing technique with one hand on chest, one on belly.  Tension in abdomen preventing abdominal wall exurcursion.  Pt will had to use  the bthroom to void after treatment and reports no pressure feeling that she had noticed, but did have urgency.  Pt will benefit from skilled PT to conintue working on improving muscle length in abdomen and pelvic floor.    PT Treatment/Interventions  ADLs/Self Care Home Management;Biofeedback;Cryotherapy;Electrical Stimulation;Moist Heat;Neuromuscular re-education;Therapeutic exercise;Therapeutic activities;Patient/family education;Manual techniques;Dry needling;Passive range of motion;Taping    PT Next Visit Plan  f/u on stretches and dilatora, abdominal wall STM and internal pelvic floor STM    PT Home Exercise Plan  Access Code: [email or web address]    Consulted and Agree with Plan of Care  Patient       Patient will benefit from skilled therapeutic intervention in order to improve the following deficits and impairments:  Impaired tone, Pain, Decreased strength, Decreased endurance, Impaired flexibility, Increased muscle spasms, Increased fascial restricitons, Decreased coordination  Visit Diagnosis: Unspecified lack of coordination  Cramp and spasm  Muscle weakness (generalized)     Problem List Patient Active Problem List   Diagnosis Date Noted  . S/P abdominal hysterectomy 04/08/2019    Jule Ser, PT 11/10/2019, 3:16 PM  [place] [address], [place], [place], [address] Phone: [phone]   Fax:  [phone]  Name: Lindsay Jackson MRN: [id number] Date of Birth: 1970/10/11

## 2019-11-10 NOTE — Patient Instructions (Addendum)
PROTOCOL FOR VAGINAL DILATORS   1. Wash dilator with soap and water prior to insertion.    2. Lay on your back reclined. Knees are to be up and apart while on your bed or in the bathtub with warm water.   3. Lubricate the end of the dilator with a water-soluble lubricant.  4. Separate the labia.   5. Tense the pelvic floor muscles than relax; while relaxing, slide lubricated dilator( round side of dilator) into the vagina.  Insert toward the direction of your spine. Dilator should feel snug and no pain more than 3/10.   6. Tense muscles again while holding the dilator so it does not get pushed out; relax and slide it in a little further.   7. Try blowing out as if filling a balloon; this may relax the muscles and allow penetration.  Repeat blowing out to insert dilator further.  8. Keep dilator in for 10 minutes if tolerate, with the pelvic floor muscles relaxed to further stretch the canal.   9. Never force the dilator into the canal. 10. Once the dilator is comfortable start to move in and out, side to side, move your hips in different directions  11. 3-4 times per week 12. Progression of dilator.  a. When you are able to place dilator into vaginal canal and feel no pain or able to move without difficulty you are ready for the next size.  b. Before you go to the next size start with the original size for 2 minutes then use the next size up for 5 minutes. c. When the next size up is easy to use, do not have to start with the smaller size.  [facility] [address], [address] [place], [place] [address] Phone # [phone] Fax [phone]  

## 2019-11-17 ENCOUNTER — Encounter: Payer: Managed Care, Other (non HMO) | Admitting: Physical Therapy

## 2019-11-18 ENCOUNTER — Other Ambulatory Visit: Payer: Self-pay

## 2019-11-18 ENCOUNTER — Encounter: Payer: Self-pay | Admitting: Physical Therapy

## 2019-11-18 ENCOUNTER — Ambulatory Visit: Payer: Managed Care, Other (non HMO) | Admitting: Physical Therapy

## 2019-11-18 DIAGNOSIS — M6281 Muscle weakness (generalized): Secondary | ICD-10-CM

## 2019-11-18 DIAGNOSIS — R279 Unspecified lack of coordination: Secondary | ICD-10-CM | POA: Diagnosis not present

## 2019-11-18 DIAGNOSIS — R252 Cramp and spasm: Secondary | ICD-10-CM

## 2019-11-18 NOTE — Therapy (Signed)
[facility] [address], [place], [place], [address] Phone: [phone]   Fax:  [phone]  Physical Therapy Treatment  Patient Details  Name: Lindsay Jackson MRN: [id number] Date of Birth: 1970-08-22 Referring Provider (PT): Franchot Gallo, MD   Encounter Date: 11/18/2019  PT End of Session - 11/18/19 1709    Visit Number  6    Date for PT Re-Evaluation  12/22/19    PT Start Time  [id number]    PT Stop Time  [id number]    PT Time Calculation (min)  43 min    Activity Tolerance  Patient tolerated treatment well    Behavior During Therapy  [facility] for tasks assessed/performed       Past Medical History:  Diagnosis Date  . 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 Fibroids   . Hematuria    reports she has microscopic blood in urine ,  sees a kidney specialist  every year to monitor it   . Hypertension   . Menorrhagia   . Mitral valve prolapse    age 49  , reports underwent echo 3-4 years ago after blood pressure went up during colonoscopy , reports echo was fine and has never had any heart problems     Past Surgical History:  Procedure Laterality Date  . ABDOMINAL HYSTERECTOMY Bilateral 04/08/2019   Procedure: HYSTERECTOMY ABDOMINAL, BILATERAL SALPINGECTOMY;  Surgeon: [name], MD;  Location: [place];  Service: Gynecology;  Laterality: Bilateral;  Need bed  . COLONOSCOPY     with remval of anal carcinoma     There were no vitals filed for this visit.  Subjective Assessment - 11/18/19 1605    Subjective  Pt states it was maybe a little bit better, but had soreness and wetness after treatment last time.    Patient Stated Goals  get rid of pressure with urination, pain with intercourse    Currently in Pain?  No/denies                       [facility] Adult PT Treatment/Exercise - 11/19/19 0001      Self-Care   Other  Self-Care Comments   used dilators and educated and demo how to increase size; stretching       Neuro Re-ed    Neuro Re-ed Details   tactile cues to contract, relax and bulge      Lumbar Exercises: Supine   Bent Knee Raise  20 reps    Bridge  10 reps    Bridge with clamshell  10 reps      Manual Therapy   Manual therapy comments  pt identity confirmed and informed consent given    Internal Pelvic Floor  stretching to circular muscles - able to get more stretches not as much tension today             PT Education - 11/18/19 1629    Education Details  Access Code: [email or web address]    Person(s) Educated  Patient    Methods  Explanation;Demonstration;Tactile cues;Verbal cues;Handout    Comprehension  Verbalized understanding;Returned demonstration          PT Long Term Goals - 11/03/19 1335      PT LONG TERM GOAL #1   Title  50% less frequency of muscle spasms in pelvic floor    Baseline  spasms this  morning when driving and had some with pressure when sitting on the ball    Status  On-going      PT LONG TERM GOAL #2   Title  At least 50% less pain after intercourse    Status  On-going      PT LONG TERM GOAL #3   Title  not having pulling or pressure before and after voiding - reduced by 75%    Baseline  60-70% improved not as frequent    Status  On-going      PT LONG TERM GOAL #4   Title  ind with HEP    Status  On-going            Plan - 11/19/19 0802    Clinical Impression Statement  Pt did well with instructions on using the dilators.  She had less muscle tension but still very TTP surrounding the rectum and urethra. Today added some strengthening as she was able to relax after contracting the muscles.  Pt will benefit from skilled PT tocontinue to work on muscle coordinating.    PT Treatment/Interventions  ADLs/Self Care Home Management;Biofeedback;Cryotherapy;Electrical Stimulation;Moist Heat;Neuromuscular re-education;Therapeutic exercise;Therapeutic  activities;Patient/family education;Manual techniques;Dry needling;Passive range of motion;Taping    PT Next Visit Plan  f/u on stretches and dilator, abdominal wall STM, breathing and bulging qped, child pose rotation, update HEP as needed, press ups    PT Home Exercise Plan  Access Code: [email or web address]    Consulted and Agree with Plan of Care  Patient       Patient will benefit from skilled therapeutic intervention in order to improve the following deficits and impairments:  Impaired tone, Pain, Decreased strength, Decreased endurance, Impaired flexibility, Increased muscle spasms, Increased fascial restricitons, Decreased coordination  Visit Diagnosis: Unspecified lack of coordination  Cramp and spasm  Muscle weakness (generalized)     Problem List Patient Active Problem List   Diagnosis Date Noted  . S/P abdominal hysterectomy 04/08/2019    Jule Ser, PT 11/19/2019, 9:22 AM  [facility] [address], [place], [place], [address] Phone: [phone]   Fax:  [phone]  Name: Lindsay Jackson MRN: [id number] Date of Birth: Apr 17, 1971

## 2019-11-18 NOTE — Patient Instructions (Signed)
Access Code: [email or web address] URL: [email or web address] Date: 11/18/2019 Prepared by: Jari Favre  Exercises Seated Piriformis Stretch with Trunk Bend - 1 x daily - 7 x weekly - 2 reps - 1 sets - 30 sec hold Seated Hamstring Stretch - 1 x daily - 7 x weekly - 2 reps - 1 sets - 30 sec hold Supine Butterfly Groin Stretch - 1 x daily - 7 x weekly - 1 reps - 1 sets - 1 min hold Supine Hamstring Stretch - 1 x daily - 7 x weekly - 2 reps - 1 sets - sec hold Supine Figure 4 Piriformis Stretch - 1 x daily - 7 x weekly - 2 reps - 1 sets - 30 sec hold Supine Piriformis Stretch with Foot on Ground - 1 x daily - 7 x weekly - 2 reps - 1 sets - 30 sec hold Standing Hip Flexor Stretch - 3 x daily - 7 x weekly - 2 reps - 1 sets - 30 sec hold Standing Hamstring Stretch with Step - 1 x daily - 7 x weekly - 3 reps - 1 sets - 30 sec hold Seated Diaphragmatic Breathing - 3 x daily - 7 x weekly - 10 reps - 1 sets Child's Pose with Sidebending - 1 x daily - 7 x weekly - 3 reps - 1 sets - 30 sec hold Supine Straight Leg Raises - 1 x daily - 7 x weekly - 10 reps - 3 sets Sidelying Hip Abduction - 1 x daily - 7 x weekly - 10 reps - 3 sets Supine 90/90 Shoulder Flexion with Abdominal Bracing - 1 x daily - 7 x weekly - 10 reps - 3 sets Supine Pelvic Floor Contraction - 3 x daily - 7 x weekly - 1 sets - 10 reps - 3 sec hold

## 2019-11-23 ENCOUNTER — Ambulatory Visit: Payer: Managed Care, Other (non HMO) | Attending: Internal Medicine

## 2019-11-23 DIAGNOSIS — Z23 Encounter for immunization: Secondary | ICD-10-CM

## 2019-11-23 NOTE — Progress Notes (Signed)
   Covid-19 Vaccination Clinic  Name:  Lindsay Jackson    MRN: [id number] DOB: August 21, 1970  11/23/2019  Lindsay Jackson was observed post Covid-19 immunization for 15 minutes without incident. She was provided with Vaccine Information Sheet and instruction to access the V-Safe system.   Lindsay Jackson was instructed to call 911 with any severe reactions post vaccine: Marland Kitchen Difficulty breathing  . Swelling of face and throat  . A fast heartbeat  . A bad rash all over body  . Dizziness and weakness   Immunizations Administered    Name Date Dose VIS Date Route   Pfizer COVID-19 Vaccine 11/23/2019  9:39 AM 0.3 mL 09/22/2018 Intramuscular   Manufacturer: [place]   Lot: [id number]   [place]: [id number]

## 2019-11-24 ENCOUNTER — Ambulatory Visit: Payer: Managed Care, Other (non HMO) | Admitting: Physical Therapy

## 2019-11-24 ENCOUNTER — Other Ambulatory Visit: Payer: Self-pay

## 2019-11-24 ENCOUNTER — Encounter: Payer: Self-pay | Admitting: Physical Therapy

## 2019-11-24 DIAGNOSIS — R279 Unspecified lack of coordination: Secondary | ICD-10-CM | POA: Diagnosis not present

## 2019-11-24 DIAGNOSIS — R252 Cramp and spasm: Secondary | ICD-10-CM

## 2019-11-24 DIAGNOSIS — M6281 Muscle weakness (generalized): Secondary | ICD-10-CM

## 2019-11-24 NOTE — Therapy (Signed)
[facility] [address], [place], [place], [address] Phone: [phone]   Fax:  [phone]  Physical Therapy Treatment  Patient Details  Name: Lindsay Jackson MRN: [id number] Date of Birth: 1971/07/18 Referring Provider (PT): Franchot Gallo, MD   Encounter Date: 11/24/2019  PT End of Session - 11/24/19 1401    Visit Number  7    Date for PT Re-Evaluation  12/22/19    PT Start Time  1316    PT Stop Time  1352    PT Time Calculation (min)  36 min    Activity Tolerance  Patient tolerated treatment well    Behavior During Therapy  [facility] for tasks assessed/performed       Past Medical History:  Diagnosis Date  . 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 Fibroids   . Hematuria    reports she has microscopic blood in urine ,  sees a kidney specialist  every year to monitor it   . Hypertension   . Menorrhagia   . Mitral valve prolapse    age 49  , reports underwent echo 3-4 years ago after blood pressure went up during colonoscopy , reports echo was fine and has never had any heart problems     Past Surgical History:  Procedure Laterality Date  . ABDOMINAL HYSTERECTOMY Bilateral 04/08/2019   Procedure: HYSTERECTOMY ABDOMINAL, BILATERAL SALPINGECTOMY;  Surgeon: [name], MD;  Location: [place];  Service: Gynecology;  Laterality: Bilateral;  Need bed  . COLONOSCOPY     with remval of anal carcinoma     There were no vitals filed for this visit.  Subjective Assessment - 11/24/19 1401    Subjective  I am fine currently.  had cramps and spasms off and on after the dilator    Currently in Pain?  No/denies                       [facility] Adult PT Treatment/Exercise - 11/24/19 0001      Neuro Re-ed    Neuro Re-ed Details   diaphragmatic breathing with band, TC to pelvic floor and ribcage and mirror, multiple  positions      Lumbar Exercises: Quadruped   Madcat/Old Horse  10 reps    Madcat/Old Horse Limitations  a lot of cues to get the coordination correct    Other Quadruped Lumbar Exercises  breathing with child pose; child pose with rotation             PT Education - 11/24/19 1400    Education Details  Access Code: [email or web address]    Person(s) Educated  Patient    Methods  Explanation;Demonstration;Verbal cues;Handout    Comprehension  Verbalized understanding;Returned demonstration          PT Long Term Goals - 11/03/19 1335      PT LONG TERM GOAL #1   Title  50% less frequency of muscle spasms in pelvic floor    Baseline  spasms this morning when driving and had some with pressure when sitting on the ball    Status  On-going      PT LONG TERM GOAL #2   Title  At least 50% less pain after intercourse    Status  On-going      PT LONG TERM GOAL #3   Title  not having pulling or pressure  before and after voiding - reduced by 75%    Baseline  60-70% improved not as frequent    Status  On-going      PT LONG TERM GOAL #4   Title  ind with HEP    Status  On-going            Plan - 11/24/19 1354    Clinical Impression Statement  Pt was able to use the dilators at home but reports some spasms after using.  She was educated in adjustments to not put them all the way.  Pt was educated thoroughly in breathing.  She was finally able to get a good diaphragmatic breath with her abdomen in child's pose.  Sitting and standing she has a really hard time not using accessory muscles.  She was given exercises and breathing exercises to focus on for this week.    PT Treatment/Interventions  ADLs/Self Care Home Management;Biofeedback;Cryotherapy;Electrical Stimulation;Moist Heat;Neuromuscular re-education;Therapeutic exercise;Therapeutic activities;Patient/family education;Manual techniques;Dry needling;Passive range of motion;Taping    PT Next Visit Plan  f/u on breathing, spasms after  using dilator, abdominal wall STM    PT Home Exercise Plan  Access Code: [email or web address]    Consulted and Agree with Plan of Care  Patient       Patient will benefit from skilled therapeutic intervention in order to improve the following deficits and impairments:  Impaired tone, Pain, Decreased strength, Decreased endurance, Impaired flexibility, Increased muscle spasms, Increased fascial restricitons, Decreased coordination  Visit Diagnosis: Unspecified lack of coordination  Cramp and spasm  Muscle weakness (generalized)     Problem List Patient Active Problem List   Diagnosis Date Noted  . S/P abdominal hysterectomy 04/08/2019    Jule Ser, PT 11/24/2019, 2:43 PM  [facility] [address], [place], [place], [address] Phone: [phone]   Fax:  [phone]  Name: Lindsay Jackson MRN: [id number] Date of Birth: 07-Sep-1970

## 2019-11-24 NOTE — Patient Instructions (Signed)
Access Code: [email or web address] URL: [email or web address] Date: 11/24/2019 Prepared by: Jari Favre  Exercises Seated Piriformis Stretch with Trunk Bend - 1 x daily - 7 x weekly - 2 reps - 1 sets - 30 sec hold Seated Hamstring Stretch - 1 x daily - 7 x weekly - 2 reps - 1 sets - 30 sec hold Supine Butterfly Groin Stretch - 1 x daily - 7 x weekly - 1 reps - 1 sets - 1 min hold Supine Hamstring Stretch - 1 x daily - 7 x weekly - 2 reps - 1 sets - sec hold Supine Figure 4 Piriformis Stretch - 1 x daily - 7 x weekly - 2 reps - 1 sets - 30 sec hold Supine Piriformis Stretch with Foot on Ground - 1 x daily - 7 x weekly - 2 reps - 1 sets - 30 sec hold Standing Hip Flexor Stretch - 3 x daily - 7 x weekly - 2 reps - 1 sets - 30 sec hold Standing Hamstring Stretch with Step - 1 x daily - 7 x weekly - 3 reps - 1 sets - 30 sec hold Seated Diaphragmatic Breathing - 3 x daily - 7 x weekly - 10 reps - 1 sets Child's Pose with Sidebending - 1 x daily - 7 x weekly - 3 reps - 1 sets - 30 sec hold Supine Straight Leg Raises - 1 x daily - 7 x weekly - 10 reps - 3 sets Sidelying Hip Abduction - 1 x daily - 7 x weekly - 10 reps - 3 sets Supine 90/90 Shoulder Flexion with Abdominal Bracing - 1 x daily - 7 x weekly - 10 reps - 3 sets Supine Pelvic Floor Contraction - 3 x daily - 7 x weekly - 1 sets - 10 reps - 3 sec hold Cat-Camel - 1 x daily - 7 x weekly - 1 sets - 10 reps

## 2019-12-01 ENCOUNTER — Encounter: Payer: Self-pay | Admitting: Physical Therapy

## 2019-12-01 ENCOUNTER — Ambulatory Visit: Payer: Managed Care, Other (non HMO) | Attending: Urology | Admitting: Physical Therapy

## 2019-12-01 ENCOUNTER — Other Ambulatory Visit: Payer: Self-pay

## 2019-12-01 DIAGNOSIS — R252 Cramp and spasm: Secondary | ICD-10-CM | POA: Diagnosis present

## 2019-12-01 DIAGNOSIS — M6281 Muscle weakness (generalized): Secondary | ICD-10-CM | POA: Diagnosis present

## 2019-12-01 DIAGNOSIS — R279 Unspecified lack of coordination: Secondary | ICD-10-CM | POA: Diagnosis not present

## 2019-12-01 NOTE — Patient Instructions (Addendum)

## 2019-12-01 NOTE — Therapy (Signed)
[facility] [address] [place], [place], [address] Phone: [phone]   Fax:  [phone]  Physical Therapy Treatment  Patient Details  Name: Lindsay Jackson MRN: [id number] Date of Birth: [birth year] Referring Provider (PT): Franchot Gallo, MD   Encounter Date: 12/01/2019  PT End of Session - 12/01/19 1317    Visit Number  8    Date for PT Re-Evaluation  12/22/19    PT Start Time  1315    PT Stop Time  1348    PT Time Calculation (min)  33 min    Activity Tolerance  Patient tolerated treatment well    Behavior During Therapy  [facility] for tasks assessed/performed       Past Medical History:  Diagnosis Date  . 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 Fibroids   . Hematuria    reports she has microscopic blood in urine ,  sees a kidney specialist  every year to monitor it   . Hypertension   . Menorrhagia   . Mitral valve prolapse    age 49  , reports underwent echo 3-4 years ago after blood pressure went up during colonoscopy , reports echo was fine and has never had any heart problems     Past Surgical History:  Procedure Laterality Date  . ABDOMINAL HYSTERECTOMY Bilateral 04/08/2019   Procedure: HYSTERECTOMY ABDOMINAL, BILATERAL SALPINGECTOMY;  Surgeon: [name], MD;  Location: [place];  Service: Gynecology;  Laterality: Bilateral;  Need bed  . COLONOSCOPY     with remval of anal carcinoma     There were no vitals filed for this visit.  Subjective Assessment - 12/01/19 1357    Subjective  Still having cramps/spasms after using the dilator but it is getting better. The pressure with urinating is better just very little.    Patient Stated Goals  get rid of pressure with urination, pain with intercourse    Currently in Pain?  No/denies                       [facility] Adult PT Treatment/Exercise - 12/01/19  0001      Self-Care   Other Self-Care Comments   educated and reviewed different ways to use dilators and breathe with stretching      Lumbar Exercises: Aerobic   Nustep  L1 x 5 min PT present for status update      Manual Therapy   Myofascial Release  abdominal massage recutus abdominus proximal attachment; lumbar and thoracic multifici, bilat adductors       Trigger Point Dry Needling - 12/01/19 0001    Consent Given?  Yes    Education Handout Provided  Yes    Muscles Treated Lower Quadrant  Popliteus    Muscles Treated Back/Hip  Thoracic multifidi    Popliteus Response  Twitch response elicited;Palpable increased muscle length    Thoracic multifidi response  Twitch response elicited;Palpable increased muscle length                PT Long Term Goals - 11/03/19 1335      PT LONG TERM GOAL #1   Title  50% less frequency of muscle spasms in pelvic floor    Baseline  spasms this morning when driving and had some with pressure when sitting on the ball    Status  On-going  PT LONG TERM GOAL #2   Title  At least 50% less pain after intercourse    Status  On-going      PT LONG TERM GOAL #3   Title  not having pulling or pressure before and after voiding - reduced by 75%    Baseline  60-70% improved not as frequent    Status  On-going      PT LONG TERM GOAL #4   Title  ind with HEP    Status  On-going            Plan - 12/01/19 1359    Clinical Impression Statement  Pt responded well to manaul and dry needling.  Since she is still having spasms today's session focused on release of tight muscles in the trunk that can be related to pelvic floor activitiy.  Overall, she continue to make progress with decreased symptoms.  She continues to need some education on various ways to use the dilators mor effectively.    PT Treatment/Interventions  ADLs/Self Care Home Management;Biofeedback;Cryotherapy;Electrical Stimulation;Moist Heat;Neuromuscular  re-education;Therapeutic exercise;Therapeutic activities;Patient/family education;Manual techniques;Dry needling;Passive range of motion;Taping    PT Next Visit Plan  f/u on DN to adductors and thoracic multifidi, practice bulging muscles and tactile cues to ensure she is doing this correctly    PT Home Exercise Plan  Access Code: [email or web address]    Consulted and Agree with Plan of Care  Patient       Patient will benefit from skilled therapeutic intervention in order to improve the following deficits and impairments:  Impaired tone, Pain, Decreased strength, Decreased endurance, Impaired flexibility, Increased muscle spasms, Increased fascial restricitons, Decreased coordination  Visit Diagnosis: Unspecified lack of coordination  Cramp and spasm  Muscle weakness (generalized)     Problem List Patient Active Problem List   Diagnosis Date Noted  . S/P abdominal hysterectomy 04/08/2019    Jule Ser, PT 12/01/2019, 2:02 PM  [facility] [address] [place], [place], [address] Phone: [phone]   Fax:  [phone]  Name: Lindsay Jackson MRN: [id number] Date of Birth: 1971-02-24

## 2019-12-08 ENCOUNTER — Ambulatory Visit: Payer: Managed Care, Other (non HMO) | Admitting: Physical Therapy

## 2019-12-08 ENCOUNTER — Other Ambulatory Visit: Payer: Self-pay

## 2019-12-08 ENCOUNTER — Encounter: Payer: Self-pay | Admitting: Physical Therapy

## 2019-12-08 DIAGNOSIS — R279 Unspecified lack of coordination: Secondary | ICD-10-CM

## 2019-12-08 DIAGNOSIS — M6281 Muscle weakness (generalized): Secondary | ICD-10-CM

## 2019-12-08 DIAGNOSIS — R252 Cramp and spasm: Secondary | ICD-10-CM

## 2019-12-08 NOTE — Therapy (Signed)
[facility] [address] [place], [place], [address] Phone: [phone]   Fax:  [phone]  Physical Therapy Treatment  Patient Details  Name: Lindsay Jackson MRN: [id number] Date of Birth: [birth year] Referring Provider (PT): Franchot Gallo, MD   Encounter Date: 12/08/2019  PT End of Session - 12/08/19 1355    Visit Number  9    Date for PT Re-Evaluation  12/22/19    PT Start Time  4270    PT Stop Time  1352    PT Time Calculation (min)  46 min    Activity Tolerance  Patient tolerated treatment well    Behavior During Therapy  [facility] for tasks assessed/performed       Past Medical History:  Diagnosis Date  . 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 Fibroids   . Hematuria    reports she has microscopic blood in urine ,  sees a kidney specialist  every year to monitor it   . Hypertension   . Menorrhagia   . Mitral valve prolapse    age 37  , reports underwent echo 3-4 years ago after blood pressure went up during colonoscopy , reports echo was fine and has never had any heart problems     Past Surgical History:  Procedure Laterality Date  . ABDOMINAL HYSTERECTOMY Bilateral 04/08/2019   Procedure: HYSTERECTOMY ABDOMINAL, BILATERAL SALPINGECTOMY;  Surgeon: [name], MD;  Location: [facility];  Service: Gynecology;  Laterality: Bilateral;  Need bed  . COLONOSCOPY     with remval of anal carcinoma     There were no vitals filed for this visit.  Subjective Assessment - 12/08/19 1312    Subjective  Pt states she is feeling better.    Currently in Pain?  No/denies                        [facility] Adult PT Treatment/Exercise - 12/08/19 0001      Lumbar Exercises: Stretches   Active Hamstring Stretch  Right;Left;2 reps;30 seconds    Hip Flexor Stretch  Right;Left;2 reps;30 seconds   on ball     Lumbar  Exercises: Quadruped   Other Quadruped Lumbar Exercises  prone and supine lying on ball breathing and stretching/relaxing core, back and pelvic floor      Manual Therapy   Myofascial Release  thoracic spine and adductors       Trigger Point Dry Needling - 12/08/19 0001    Consent Given?  Yes    Education Handout Provided  Previously provided    Popliteus Response  Twitch response elicited;Palpable increased muscle length    Thoracic multifidi response  Twitch response elicited;Palpable increased muscle length                PT Long Term Goals - 12/08/19 1323      PT LONG TERM GOAL #1   Title  50% less frequency of muscle spasms in pelvic floor    Baseline  90% improved    Status  Achieved      PT LONG TERM GOAL #2   Title  At least 50% less pain after intercourse    Baseline  no problem last time, it has definitely improved but just not sure how much, seems like at least  50%    Status  Achieved  PT LONG TERM GOAL #3   Title  not having pulling or pressure before and after voiding - reduced by 75%    Baseline  90% improved    Status  Achieved      PT LONG TERM GOAL #4   Title  ind with HEP    Status  Achieved            Plan - 12/08/19 1355    Clinical Impression Statement  Pt has met all goals and will discharge from skilled PT with HEP at this time    PT Treatment/Interventions  ADLs/Self Care Home Management;Biofeedback;Cryotherapy;Electrical Stimulation;Moist Heat;Neuromuscular re-education;Therapeutic exercise;Therapeutic activities;Patient/family education;Manual techniques;Dry needling;Passive range of motion;Taping    PT Next Visit Plan  d/c today    PT Home Exercise Plan  Access Code: [email or web address]    Consulted and Agree with Plan of Care  Patient       Patient will benefit from skilled therapeutic intervention in order to improve the following deficits and impairments:  Impaired tone, Pain, Decreased strength, Decreased endurance, Impaired  flexibility, Increased muscle spasms, Increased fascial restricitons, Decreased coordination  Visit Diagnosis: Unspecified lack of coordination  Cramp and spasm  Muscle weakness (generalized)     Problem List Patient Active Problem List   Diagnosis Date Noted  . S/P abdominal hysterectomy 04/08/2019    Jule Ser, PT 12/08/2019, 2:00 PM  [facility] [address] [place], [place], [address] Phone: [phone]   Fax:  [phone]  Name: Lindsay Jackson MRN: [id number] Date of Birth: 19-Feb-1971  PHYSICAL THERAPY DISCHARGE SUMMARY  Visits from Start of Care: 9  Current functional level related to goals / functional outcomes: See above   Remaining deficits: See above   Education / Equipment: HEP Plan: Patient agrees to discharge.  Patient goals were met. Patient is being discharged due to meeting the stated rehab goals.  ?????     [employer], PT 12/08/19 2:01 PM

## 2021-07-05 ENCOUNTER — Other Ambulatory Visit: Payer: Self-pay | Admitting: Obstetrics and Gynecology

## 2021-07-05 DIAGNOSIS — R928 Other abnormal and inconclusive findings on diagnostic imaging of breast: Secondary | ICD-10-CM

## 2021-08-22 ENCOUNTER — Ambulatory Visit
Admission: RE | Admit: 2021-08-22 | Discharge: 2021-08-22 | Disposition: A | Discharge status: Home / Self Care | Payer: Managed Care, Other (non HMO) | Source: Ambulatory Visit | Attending: Obstetrics and Gynecology | Admitting: Obstetrics and Gynecology

## 2021-08-22 ENCOUNTER — Ambulatory Visit: Admission: RE | Admit: 2021-08-22 | Payer: Managed Care, Other (non HMO) | Source: Ambulatory Visit

## 2021-08-22 DIAGNOSIS — R928 Other abnormal and inconclusive findings on diagnostic imaging of breast: Secondary | ICD-10-CM

## 2023-06-08 ENCOUNTER — Ambulatory Visit
Admission: EM | Admit: 2023-06-08 | Discharge: 2023-06-08 | Disposition: A | Discharge status: Home / Self Care | Payer: BC Managed Care – PPO | Attending: Emergency Medicine | Admitting: Emergency Medicine

## 2023-06-08 ENCOUNTER — Encounter: Payer: Self-pay | Admitting: *Deleted

## 2023-06-08 DIAGNOSIS — M542 Cervicalgia: Secondary | ICD-10-CM

## 2023-06-08 MED ORDER — PREDNISONE 10 MG (21) PO TBPK: ORAL_TABLET | Freq: Every day | ORAL | 0 refills | Status: AC

## 2023-06-08 MED ORDER — CYCLOBENZAPRINE HCL 10 MG PO TABS: 10.0000 mg | ORAL_TABLET | Freq: Every day | ORAL | 0 refills | Status: AC

## 2023-06-08 NOTE — Discharge Instructions (Addendum)
Your pain is most likely caused by irritation to the muscles.   Begin prednisone as directed to reduce inflammation and help with pain, may take Tylenol or any topical medicines in addition to this  May use muscle relaxer at bedtime as needed for additional comfort, be mindful this can make you feel sleepy  You may use heating pad in 15 minute intervals as needed for additional comfort, or  you may find comfort in using ice in 10-15 minutes over affected area  Begin massaging or  stretching affected area daily for 10 minutes as tolerated to further loosen muscles   When sitting and  lying down place pillow underneath and between knees for support  Can try sleeping without pillow on firm mattress   Practice good posture: head back, shoulders back, chest forward, pelvis back and weight distributed evenly on both legs  If pain persist after recommended treatment or reoccurs if may be beneficial to follow up with orthopedic specialist for evaluation, this doctor specializes in the bones and can manage your symptoms long-term with options such as but not limited to imaging, medications or physical therapy

## 2023-06-08 NOTE — ED Triage Notes (Signed)
Patient states neck pain bilateral back and around to front for about 2 weeks.  No known injury or falls, does work out.  No left arm or chest pain

## 2023-06-08 NOTE — ED Provider Notes (Signed)
Renaldo Fiddler    CSN: [id number] Arrival date & time: 06/08/23  1242      History   Chief Complaint Chief Complaint  Patient presents with   Neck Pain    HPI Lindsay Jackson is a 52 y.o. female.   Patient presents for evaluation of bilateral neck pain experiencing the posterior and anterior of the neck, has begun to experience pain in the top of the shoulder blades beginning over the last 1 to 2 days.  Pain described as a burning sensation to the posterior and a soreness to the anterior.  Able to complete range of motion but exacerbates symptoms.  Symptoms initially beginning 2 weeks ago.  Endorses pushing pulling and lifting but denies direct injury.  Denies numbness or tingling.  Has attempted use of Tylenol and ibuprofen with minimal improvement.  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 during my surgery    Fibroids    Hematuria    reports she has microscopic blood in urine ,  sees a kidney specialist  every year to monitor it    Hypertension    Menorrhagia    Mitral valve prolapse    age 62  , reports underwent echo 3-4 years ago after blood pressure went up during colonoscopy , reports echo was fine and has never had any heart problems     Patient Active Problem List   Diagnosis Date Noted   S/P abdominal hysterectomy 04/08/2019    Past Surgical History:  Procedure Laterality Date   ABDOMINAL HYSTERECTOMY Bilateral 04/08/2019   Procedure: HYSTERECTOMY ABDOMINAL, BILATERAL SALPINGECTOMY;  Surgeon: Richardean Chimera, MD;  Location: [facility];  Service: Gynecology;  Laterality: Bilateral;  Need bed   COLONOSCOPY     with remval of anal carcinoma     OB History   No obstetric history on file.      Home Medications    Prior to Admission medications   Medication Sig Start Date End Date Taking? Authorizing Provider  cyclobenzaprine (FLEXERIL) 10 MG tablet Take  1 tablet (10 mg total) by mouth at bedtime. 06/08/23  Yes Athene Schuhmacher R, NP  predniSONE (STERAPRED UNI-PAK 21 TAB) 10 MG (21) TBPK tablet Take by mouth daily. Take 6 tabs by mouth daily  for 1 days, then 5 tabs for 1 days, then 4 tabs for 1 days, then 3 tabs for 1 days, 2 tabs for 1 days, then 1 tab by mouth daily for 1 days 06/08/23  Yes Shaquon Gropp R, NP  enalapril (VASOTEC) 10 MG tablet Take 10 mg by mouth every evening.  02/22/15 03/29/19  [provider]  levonorgestrel-ethinyl estradiol (ENPRESSE,TRIVORA) tablet Take 1 tablet by mouth daily at 2 PM.     [provider]  Multiple Vitamin (MULTI-VITAMINS) TABS Take 1 tablet by mouth daily.    [provider]  VITAMIN D PO Take 1 capsule by mouth daily.    [provider]    Family History History reviewed. No pertinent family history.  Social History Social History   Tobacco Use   Smoking status: Never   Smokeless tobacco: Never  Vaping Use   Vaping status: Never Used  Substance Use Topics   Alcohol use: Yes    Comment: occ     Allergies   Elemental sulfur   Review of Systems Review of Systems   Physical Exam Triage Vital  Signs ED Triage Vitals  Encounter Vitals Group     BP 06/08/23 1326 135/77     Systolic BP Percentile --      Diastolic BP Percentile --      Pulse Rate 06/08/23 1326 75     Resp 06/08/23 1326 16     Temp 06/08/23 1326 98.2 F (36.8 C)     Temp Source 06/08/23 1326 Oral     SpO2 06/08/23 1326 99 %     Weight 06/08/23 1324 115 lb (52.2 kg)     Height 06/08/23 1324 5\' 6"  (1.676 m)     Head Circumference --      Peak Flow --      Pain Score 06/08/23 1324 6     Pain Loc --      Pain Education --      Exclude from Growth Chart --    No data found.  Updated Vital Signs BP 135/77 (BP Location: Left Arm)   Pulse 75   Temp 98.2 F (36.8 C) (Oral)   Resp 16   Ht 5\' 6"  (1.676 m)   Wt 115 lb (52.2 kg)   LMP 03/07/2019 (Approximate)   SpO2 99%   BMI  18.56 kg/m   Visual Acuity Right Eye Distance:   Left Eye Distance:   Bilateral Distance:    Right Eye Near:   Left Eye Near:    Bilateral Near:     Physical Exam Constitutional:      Appearance: Normal appearance.  Eyes:     Extraocular Movements: Extraocular movements intact.  Neck:     Comments: Tenderness present along the bilateral aspects of the neck extending to the superior of the shoulder and the top of the bilateral shoulder blades, no ecchymosis swelling or deformity, able to tolerate range of motion of the neck, 2+ carotid pulses bilaterally, no spinal tenderness on exam Neurological:     Mental Status: She is alert and oriented to person, place, and time.      UC Treatments / Results  Labs (all labs ordered are listed, but only abnormal results are displayed) Labs Reviewed - No data to display  EKG   Radiology No results found.  Procedures Procedures (including critical care time)  Medications Ordered in UC Medications - No data to display  Initial Impression / Assessment and Plan / UC Course  I have reviewed the triage vital signs and the nursing notes.  Pertinent labs & imaging results that were available during my care of the patient were reviewed by me and considered in my medical decision making (see chart for details).  Neck pain, bilateral  Etiology most likely muscular low suspicion for spinal involvement, no tenderness on exam, stable for outpatient management, declined injection, prescribed prednisone and Flexeril for home use recommended RICE, heat massage stretching with activity as tolerated and walking referral given to orthopedics if symptoms continue to persist Final Clinical Impressions(s) / UC Diagnoses   Final diagnoses:  Neck pain, bilateral     Discharge Instructions      Your pain is most likely caused by irritation to the muscles.   Begin prednisone as directed to reduce inflammation and help with pain, may take Tylenol  or any topical medicines in addition to this  May use muscle relaxer at bedtime as needed for additional comfort, be mindful this can make you feel sleepy  You may use heating pad in 15 minute intervals as needed for additional comfort, or  you may  find comfort in using ice in 10-15 minutes over affected area  Begin massaging or  stretching affected area daily for 10 minutes as tolerated to further loosen muscles   When sitting and  lying down place pillow underneath and between knees for support  Can try sleeping without pillow on firm mattress   Practice good posture: head back, shoulders back, chest forward, pelvis back and weight distributed evenly on both legs  If pain persist after recommended treatment or reoccurs if may be beneficial to follow up with orthopedic specialist for evaluation, this doctor specializes in the bones and can manage your symptoms long-term with options such as but not limited to imaging, medications or physical therapy      ED Prescriptions     Medication Sig Dispense Auth. Provider   predniSONE (STERAPRED UNI-PAK 21 TAB) 10 MG (21) TBPK tablet Take by mouth daily. Take 6 tabs by mouth daily  for 1 days, then 5 tabs for 1 days, then 4 tabs for 1 days, then 3 tabs for 1 days, 2 tabs for 1 days, then 1 tab by mouth daily for 1 days 21 tablet Azavier Creson R, NP   cyclobenzaprine (FLEXERIL) 10 MG tablet Take 1 tablet (10 mg total) by mouth at bedtime. 10 tablet Valinda Hoar, NP      PDMP not reviewed this encounter.   Valinda Hoar, NP 06/08/23 [phone]

## 2023-06-30 DIAGNOSIS — S161XXA Strain of muscle, fascia and tendon at neck level, initial encounter: Secondary | ICD-10-CM | POA: Diagnosis not present

## 2023-06-30 DIAGNOSIS — M47812 Spondylosis without myelopathy or radiculopathy, cervical region: Secondary | ICD-10-CM | POA: Diagnosis not present

## 2023-08-11 DIAGNOSIS — Z0000 Encounter for general adult medical examination without abnormal findings: Secondary | ICD-10-CM | POA: Diagnosis not present

## 2023-08-11 DIAGNOSIS — M542 Cervicalgia: Secondary | ICD-10-CM | POA: Diagnosis not present

## 2023-08-11 DIAGNOSIS — E559 Vitamin D deficiency, unspecified: Secondary | ICD-10-CM | POA: Diagnosis not present

## 2023-08-11 DIAGNOSIS — I10 Essential (primary) hypertension: Secondary | ICD-10-CM | POA: Diagnosis not present

## 2023-08-11 DIAGNOSIS — R3129 Other microscopic hematuria: Secondary | ICD-10-CM | POA: Diagnosis not present

## 2023-12-08 DIAGNOSIS — M7701 Medial epicondylitis, right elbow: Secondary | ICD-10-CM | POA: Diagnosis not present

## 2023-12-08 DIAGNOSIS — L729 Follicular cyst of the skin and subcutaneous tissue, unspecified: Secondary | ICD-10-CM | POA: Diagnosis not present

## 2023-12-08 DIAGNOSIS — I10 Essential (primary) hypertension: Secondary | ICD-10-CM | POA: Diagnosis not present

## 2024-01-13 DIAGNOSIS — Z01419 Encounter for gynecological examination (general) (routine) without abnormal findings: Secondary | ICD-10-CM | POA: Diagnosis not present

## 2024-01-13 DIAGNOSIS — Z1231 Encounter for screening mammogram for malignant neoplasm of breast: Secondary | ICD-10-CM | POA: Diagnosis not present

## 2024-01-13 DIAGNOSIS — R319 Hematuria, unspecified: Secondary | ICD-10-CM | POA: Diagnosis not present

## 2024-01-13 DIAGNOSIS — Z681 Body mass index (BMI) 19 or less, adult: Secondary | ICD-10-CM | POA: Diagnosis not present

## 2024-01-13 DIAGNOSIS — Z1151 Encounter for screening for human papillomavirus (HPV): Secondary | ICD-10-CM | POA: Diagnosis not present

## 2024-01-13 DIAGNOSIS — Z1272 Encounter for screening for malignant neoplasm of vagina: Secondary | ICD-10-CM | POA: Diagnosis not present

## 2024-02-12 DIAGNOSIS — R87625 Unsatisfactory cytologic smear of vagina: Secondary | ICD-10-CM | POA: Diagnosis not present

## 2024-03-15 DIAGNOSIS — N390 Urinary tract infection, site not specified: Secondary | ICD-10-CM | POA: Diagnosis not present

## 2024-03-15 DIAGNOSIS — R87625 Unsatisfactory cytologic smear of vagina: Secondary | ICD-10-CM | POA: Diagnosis not present

## 2024-04-05 DIAGNOSIS — Z13820 Encounter for screening for osteoporosis: Secondary | ICD-10-CM | POA: Diagnosis not present

## 2024-04-05 DIAGNOSIS — N390 Urinary tract infection, site not specified: Secondary | ICD-10-CM | POA: Diagnosis not present

## 2024-05-04 DIAGNOSIS — N958 Other specified menopausal and perimenopausal disorders: Secondary | ICD-10-CM | POA: Diagnosis not present

## 2024-05-04 DIAGNOSIS — R8290 Unspecified abnormal findings in urine: Secondary | ICD-10-CM | POA: Diagnosis not present

## 2024-05-04 DIAGNOSIS — R3121 Asymptomatic microscopic hematuria: Secondary | ICD-10-CM | POA: Diagnosis not present

## 2024-05-04 DIAGNOSIS — R351 Nocturia: Secondary | ICD-10-CM | POA: Diagnosis not present

## 2024-05-12 DIAGNOSIS — N3020 Other chronic cystitis without hematuria: Secondary | ICD-10-CM | POA: Diagnosis not present

## 2024-06-15 DIAGNOSIS — R3121 Asymptomatic microscopic hematuria: Secondary | ICD-10-CM | POA: Diagnosis not present

## 2024-06-15 DIAGNOSIS — R300 Dysuria: Secondary | ICD-10-CM | POA: Diagnosis not present

## 2024-06-28 ENCOUNTER — Other Ambulatory Visit: Payer: Self-pay | Admitting: Urology

## 2024-06-28 DIAGNOSIS — R300 Dysuria: Secondary | ICD-10-CM

## 2024-07-06 DIAGNOSIS — R3121 Asymptomatic microscopic hematuria: Secondary | ICD-10-CM | POA: Diagnosis not present

## 2024-07-08 ENCOUNTER — Inpatient Hospital Stay: Admission: RE | Admit: 2024-07-08 | Discharge: 2024-07-08 | Attending: Urology | Admitting: Urology

## 2024-07-08 DIAGNOSIS — R300 Dysuria: Secondary | ICD-10-CM | POA: Diagnosis not present

## 2024-07-13 DIAGNOSIS — R3121 Asymptomatic microscopic hematuria: Secondary | ICD-10-CM | POA: Diagnosis not present

## 2024-07-13 DIAGNOSIS — R301 Vesical tenesmus: Secondary | ICD-10-CM | POA: Diagnosis not present
